# Patient Record
Sex: Female | Born: 1955 | Hispanic: No | Marital: Married | Smoking: Never smoker
Health system: Southern US, Community
[De-identification: ages and names within clinical notes are randomized; demographics above are authoritative.]

## PROBLEM LIST (undated history)

## (undated) DIAGNOSIS — M1990 Unspecified osteoarthritis, unspecified site: Secondary | ICD-10-CM

## (undated) DIAGNOSIS — E7800 Pure hypercholesterolemia, unspecified: Secondary | ICD-10-CM

## (undated) DIAGNOSIS — K649 Unspecified hemorrhoids: Secondary | ICD-10-CM

## (undated) DIAGNOSIS — R112 Nausea with vomiting, unspecified: Secondary | ICD-10-CM

## (undated) DIAGNOSIS — K219 Gastro-esophageal reflux disease without esophagitis: Secondary | ICD-10-CM

## (undated) DIAGNOSIS — K589 Irritable bowel syndrome without diarrhea: Secondary | ICD-10-CM

## (undated) DIAGNOSIS — Z9889 Other specified postprocedural states: Secondary | ICD-10-CM

## (undated) HISTORY — PX: CHOLECYSTECTOMY: SHX55

## (undated) HISTORY — DX: Pure hypercholesterolemia, unspecified: E78.00

## (undated) HISTORY — PX: RECTO-VAGINAL FISSURE REPAIR: SHX5277

## (undated) HISTORY — DX: Gastro-esophageal reflux disease without esophagitis: K21.9

## (undated) HISTORY — PX: OTHER SURGICAL HISTORY: SHX169

---

## 2005-05-02 ENCOUNTER — Ambulatory Visit: Payer: Self-pay | Admitting: Internal Medicine

## 2010-04-04 ENCOUNTER — Ambulatory Visit: Payer: Self-pay | Admitting: Internal Medicine

## 2010-11-07 ENCOUNTER — Other Ambulatory Visit (INDEPENDENT_AMBULATORY_CARE_PROVIDER_SITE_OTHER): Payer: Self-pay | Admitting: *Deleted

## 2010-11-07 DIAGNOSIS — K219 Gastro-esophageal reflux disease without esophagitis: Secondary | ICD-10-CM

## 2010-11-08 MED ORDER — ESOMEPRAZOLE MAGNESIUM 40 MG PO CPDR: 40.0000 mg | DELAYED_RELEASE_CAPSULE | ORAL | Status: DC

## 2010-11-09 NOTE — Telephone Encounter (Signed)
RX approved.

## 2011-03-28 ENCOUNTER — Encounter (INDEPENDENT_AMBULATORY_CARE_PROVIDER_SITE_OTHER): Payer: Self-pay | Admitting: *Deleted

## 2011-03-29 ENCOUNTER — Telehealth (INDEPENDENT_AMBULATORY_CARE_PROVIDER_SITE_OTHER): Payer: Self-pay | Admitting: *Deleted

## 2011-03-29 NOTE — Telephone Encounter (Signed)
Patient lmom, states you are trying to get in touch with her about sch'dling appt, she said just sch'd it and leave info on her machine it it's not good she will call to resch

## 2011-04-05 ENCOUNTER — Encounter (INDEPENDENT_AMBULATORY_CARE_PROVIDER_SITE_OTHER): Payer: Self-pay | Admitting: Internal Medicine

## 2011-04-05 ENCOUNTER — Ambulatory Visit (INDEPENDENT_AMBULATORY_CARE_PROVIDER_SITE_OTHER): Payer: Self-pay | Admitting: Internal Medicine

## 2011-04-05 DIAGNOSIS — E7800 Pure hypercholesterolemia, unspecified: Secondary | ICD-10-CM

## 2011-04-05 DIAGNOSIS — K219 Gastro-esophageal reflux disease without esophagitis: Secondary | ICD-10-CM

## 2011-04-05 NOTE — Telephone Encounter (Signed)
Appt has been scheduled for today, 04/05/11.

## 2011-04-05 NOTE — Patient Instructions (Signed)
Will schedule a colonoscopy with Dr Karilyn Cota.  She has never undergone a colonoscopy in the past.  Continue present medications.

## 2011-04-05 NOTE — Progress Notes (Signed)
Subjective:     Patient ID: Ashley Carter, female   DOB: 09-29-1955, 55 y.o.   MRN: [id number]  HPI On the 17 and 18 of September she had epigastric pain.  On the 19th she had LFTs and she assumes they were normal. (see below labs)  On the 29th of September she was uncomfortable in her epigastric region with pressure and nausea for about 4 hours.   02/17/2011 she had epigastric discomfort which lasted less than 5 minutes.  She says since she has been on the Urso her symptoms are better.  She has been on Urso since 2010.  She says it has helped. Her acid reflux is pretty controlled.  She can tell if she ate the wrong things. Her appetite is good. No weight loss.  Her first ERCP was in [facility] at [facility]. She had a second in her ERCP in 2007. BM x 3 a day and some days she will skip a day.  She has never undergone a colonoscopy in the pat.  12/27/2010 Direct bili 0.15, Total bili 0.4, AST 27, ALT 22, ALP 139, Albumin 4.2 Review of Systems Current Outpatient Prescriptions  Medication Sig Dispense Refill  . aspirin 81 MG tablet Take 81 mg by mouth daily.        Marland Kitchen esomeprazole (NEXIUM) 40 MG capsule Take 1 capsule (40 mg total) by mouth every morning.  30 capsule  5  . hydrochlorothiazide (MICROZIDE) 12.5 MG capsule Take 12.5 mg by mouth daily.        . Multiple Vitamin (MULTIVITAMIN) tablet Take 1 tablet by mouth daily.        . niacin (NIASPAN) 1000 MG CR tablet Take 1,000 mg by mouth 2 (two) times daily.        . ursodiol (ACTIGALL) 500 MG tablet Take 500 mg by mouth 2 (two) times daily.        Past Medical History  Diagnosis Date  . GERD (gastroesophageal reflux disease)   . High cholesterol    Past Surgical History  Procedure Date  . Spincter of oddi dysfunction   . Cholecystectomy     1993  . Recto-vaginal fissure repair     after child birth   Family Status  Relation Status Death Age  . Mother Deceased     unknown, CVA  . Father Deceased     copd  . Sister Alive     one deceased  from auto acc., 4 alive. One has diabetes.   . Brother Alive     good health, alcoholic   History   Social History  . Marital Status: Single    Spouse Name: N/A    Number of Children: N/A  . Years of Education: N/A   Occupational History  . Not on file.   Social History Main Topics  . Smoking status: Never Smoker   . Smokeless tobacco: Not on file  . Alcohol Use: No  . Drug Use: No  . Sexually Active: Not on file   Other Topics Concern  . Not on file   Social History Narrative  . No narrative on file   Allergies  Allergen Reactions  . Morphine And Related   . Vibramycin       Objective:   Physical Exam  Filed Vitals:   04/05/11 1435  BP: 124/72  Pulse: 72  Temp: 97.9 F (36.6 C)  Height: 5\' 6"  (1.676 m)  Weight: 150 lb 14.4 oz (68.448 kg)    Alert and  oriented. Skin warm and dry. Oral mucosa is moist. Natural teeth in good condition. Sclera anicteric, conjunctivae is pink. Thyroid not enlarged. No cervical lymphadenopathy. Lungs clear. Heart regular rate and rhythm.  Abdomen is soft. Bowel sounds are positive. No hepatomegaly. No abdominal masses felt. No tenderness.  No edema to lower extremities. Patient is alert and oriented.      Assessment:   Spincter of Oddi Dysfunction.  In need of a colonoscopy    Plan:    Continue present medication. F/u in one year.

## 2011-04-27 ENCOUNTER — Encounter (INDEPENDENT_AMBULATORY_CARE_PROVIDER_SITE_OTHER): Payer: Self-pay | Admitting: *Deleted

## 2011-04-27 ENCOUNTER — Other Ambulatory Visit (INDEPENDENT_AMBULATORY_CARE_PROVIDER_SITE_OTHER): Payer: Self-pay | Admitting: *Deleted

## 2011-04-27 DIAGNOSIS — Z1211 Encounter for screening for malignant neoplasm of colon: Secondary | ICD-10-CM

## 2011-05-01 ENCOUNTER — Other Ambulatory Visit (INDEPENDENT_AMBULATORY_CARE_PROVIDER_SITE_OTHER): Payer: Self-pay | Admitting: Internal Medicine

## 2011-05-10 ENCOUNTER — Telehealth (INDEPENDENT_AMBULATORY_CARE_PROVIDER_SITE_OTHER): Payer: Self-pay | Admitting: *Deleted

## 2011-05-10 NOTE — Telephone Encounter (Signed)
Requesting MD/PCP:       Name & DOB: Ashley Carter 06/05/1955     Procedure: TCS  Reason/Indication:  screening  Has patient had this procedure before?  no  If so, when, by whom and where?    Is there a family history of colon cancer?  no  Who?  What age when diagnosed?    Is patient diabetic?   no      Does patient have prosthetic heart valve?  no  Do you have a pacemaker?  no  Has patient had joint replacement within last 12 months?  no  Is patient on Coumadin, Plavix and/or Aspirin? yes  Medications: asa 81 mg daily, nexium 40 mg daily, hctz 12.5 mg daily, multi vit, niacin 1000 mg bid, ursodiol 500 mg bid  Allergies: morphine, related vibramycin  Ph[facility]  Medication Adjustment: asa 2 days  Procedure date & time: 05/31/11 @ 1200

## 2011-05-11 NOTE — Telephone Encounter (Signed)
agree

## 2011-05-22 ENCOUNTER — Encounter (HOSPITAL_COMMUNITY): Payer: Self-pay | Admitting: Pharmacy Technician

## 2011-05-30 MED ORDER — SODIUM CHLORIDE 0.45 % IV SOLN
Freq: Once | INTRAVENOUS | Status: AC
  Administered 2011-05-31: 12:00:00 via INTRAVENOUS

## 2011-05-31 ENCOUNTER — Encounter (HOSPITAL_COMMUNITY)
Admission: RE | Disposition: A | Discharge status: Home / Self Care | Payer: Self-pay | Source: Ambulatory Visit | Attending: Internal Medicine

## 2011-05-31 ENCOUNTER — Ambulatory Visit (HOSPITAL_COMMUNITY)
Admission: RE | Admit: 2011-05-31 | Discharge: 2011-05-31 | Disposition: A | Discharge status: Home / Self Care | Payer: BC Managed Care – PPO | Source: Ambulatory Visit | Attending: Internal Medicine | Admitting: Internal Medicine

## 2011-05-31 ENCOUNTER — Encounter (HOSPITAL_COMMUNITY): Payer: Self-pay

## 2011-05-31 DIAGNOSIS — Z1211 Encounter for screening for malignant neoplasm of colon: Secondary | ICD-10-CM | POA: Insufficient documentation

## 2011-05-31 DIAGNOSIS — E7800 Pure hypercholesterolemia, unspecified: Secondary | ICD-10-CM | POA: Insufficient documentation

## 2011-05-31 HISTORY — DX: Unspecified osteoarthritis, unspecified site: M19.90

## 2011-05-31 HISTORY — PX: COLONOSCOPY: SHX5424

## 2011-05-31 HISTORY — DX: Irritable bowel syndrome, unspecified: K58.9

## 2011-05-31 HISTORY — DX: Unspecified hemorrhoids: K64.9

## 2011-05-31 SURGERY — COLONOSCOPY
Anesthesia: Moderate Sedation

## 2011-05-31 MED ORDER — MEPERIDINE HCL 50 MG/ML IJ SOLN
INTRAMUSCULAR | Status: AC
  Filled 2011-05-31: qty 1

## 2011-05-31 MED ORDER — MIDAZOLAM HCL 5 MG/5ML IJ SOLN
INTRAMUSCULAR | Status: DC | PRN
  Administered 2011-05-31: 1 mg via INTRAVENOUS
  Administered 2011-05-31 (×2): 2 mg via INTRAVENOUS

## 2011-05-31 MED ORDER — STERILE WATER FOR IRRIGATION IR SOLN
Status: DC | PRN
  Administered 2011-05-31: 12:00:00

## 2011-05-31 MED ORDER — MIDAZOLAM HCL 5 MG/5ML IJ SOLN
INTRAMUSCULAR | Status: AC
  Filled 2011-05-31: qty 10

## 2011-05-31 MED ORDER — MEPERIDINE HCL 50 MG/ML IJ SOLN
INTRAMUSCULAR | Status: DC | PRN
  Administered 2011-05-31 (×2): 25 mg via INTRAVENOUS

## 2011-05-31 NOTE — Discharge Instructions (Signed)
usual medications and diet.Resume  No driving for 24 hours. Next screening exam in 10 years.   Colonoscopy Care After Read the instructions outlined below and refer to this sheet in the next few weeks. These discharge instructions provide you with general information on caring for yourself after you leave the hospital. Your doctor may also give you specific instructions. While your treatment has been planned according to the most current medical practices available, unavoidable complications occasionally occur. If you have any problems or questions after discharge, call your doctor. HOME CARE INSTRUCTIONS ACTIVITY:  You may resume your regular activity, but move at a slower pace for the next 24 hours.   Take frequent rest periods for the next 24 hours.   Walking will help get rid of the air and reduce the bloated feeling in your belly (abdomen).   No driving for 24 hours (because of the medicine (anesthesia) used during the test).   You may shower.   Do not sign any important legal documents or operate any machinery for 24 hours (because of the anesthesia used during the test).  NUTRITION:  Drink plenty of fluids.   You may resume your normal diet as instructed by your doctor.   Begin with a light meal and progress to your normal diet. Heavy or fried foods are harder to digest and may make you feel sick to your stomach (nauseated).   Avoid alcoholic beverages for 24 hours or as instructed.  MEDICATIONS:  You may resume your normal medications unless your doctor tells you otherwise.  WHAT TO EXPECT TODAY:  Some feelings of bloating in the abdomen.   Passage of more gas than usual.   Spotting of blood in your stool or on the toilet paper.  IF YOU HAD POLYPS REMOVED DURING THE COLONOSCOPY:  No aspirin products for 7 days or as instructed.   No alcohol for 7 days or as instructed.   Eat a soft diet for the next 24 hours.  FINDING OUT THE RESULTS OF YOUR TEST Not all test  results are available during your visit. If your test results are not back during the visit, make an appointment with your caregiver to find out the results. Do not assume everything is normal if you have not heard from your caregiver or the medical facility. It is important for you to follow up on all of your test results.  SEEK IMMEDIATE MEDICAL CARE IF:  You have more than a spotting of blood in your stool.   Your belly is swollen (abdominal distention).   You are nauseated or vomiting.   You have a fever.   You have abdominal pain or discomfort that is severe or gets worse throughout the day.  Document Released: 11/15/2003 Document Revised: 12/13/2010 Document Reviewed: 11/13/2007 [facility] Patient Information 2012 [place], [place].

## 2011-05-31 NOTE — Op Note (Signed)
COLONOSCOPY PROCEDURE REPORT  PATIENT:  Ashley Carter  MR#:  [id number] Birthdate:  May 05, 1955, 56 y.o., female Endoscopist:  Dr. Malissa Hippo, MD Procedure Date: 05/31/2011  Procedure:   Colonoscopy  Indications: Patient is a 56 year old Caucasian female who was undergoing a screening colonoscopy. This is patient's first exam.  Informed Consent:  Procedure and risks were reviewed with the patient and informed consent was obtained.  Medications:  Demerol 50 mg IV Versed 5 mg IV  Description of procedure:  After a digital rectal exam was performed, that colonoscope was advanced from the anus through the rectum and colon to the area of the cecum, ileocecal valve and appendiceal orifice. The cecum was deeply intubated. These structures were well-seen and photographed for the record. From the level of the cecum and ileocecal valve, the scope was slowly and cautiously withdrawn. The mucosal surfaces were carefully surveyed utilizing scope tip to flexion to facilitate fold flattening as needed. The scope was pulled down into the rectum where a thorough exam including retroflexion was performed.  Findings:   Prep satisfactory. Normal mucosa of the colon and rectum. Distal rectal scar contiguous with anorectal junction.  Therapeutic/Diagnostic Maneuvers Performed:  None  Complications:  None  Cecal Withdrawal Time:  11 minutes  Impression:  Normal colonoscopy.  Recommendations:  Standard instructions given. Next screening exam in 10 years  Roch Quach U  05/31/2011 12:47 PM  CC: Dr. No primary provider on file. & Dr. No ref. provider found

## 2011-05-31 NOTE — H&P (Signed)
Ashley Carter is an 56 y.o. female.   Chief Complaint: Patient is here for colonoscopy. HPI: Patient is a 56 year old Caucasian female who is here for average risk screening colonoscopy. This is patient's first exam. She has intermittent diarrhea felt to be secondary to irritable.bowel syndrome. She denies rectal bleeding. Family history is negative for colorectal carcinoma.  Past Medical History  Diagnosis Date  . GERD (gastroesophageal reflux disease)   . High cholesterol   . Arthritis   . IBS (irritable bowel syndrome)   . Hemorrhoid     Past Surgical History  Procedure Date  . Spincter of oddi dysfunction     repair x2  . Cholecystectomy     1993  . Recto-vaginal fissure repair x 2    after child birth    Family History  Problem Relation Age of Onset  . Anesthesia problems Neg Hx   . Hypotension Neg Hx   . Malignant hyperthermia Neg Hx   . Pseudochol deficiency Neg Hx    Social History:  reports that she has never smoked. She does not have any smokeless tobacco history on file. She reports that she does not drink alcohol or use illicit drugs.  Allergies:  Allergies  Allergen Reactions  . Kiwi Extract Swelling    Of throat  . Morphine And Related Other (See Comments)    Dry heaves   . Other Swelling    walnuts  . Vibramycin Other (See Comments)    ulcers    Medications Prior to Admission  Medication Dose Route Frequency Provider Last Rate Last Dose  . 0.45 % sodium chloride infusion   Intravenous Once Malissa Hippo, MD 20 mL/hr at 05/31/11 1208    . meperidine (DEMEROL) 50 MG/ML injection           . midazolam (VERSED) 5 MG/5ML injection            Medications Prior to Admission  Medication Sig Dispense Refill  . aspirin EC 81 MG tablet Take 81 mg by mouth daily.      Marland Kitchen esomeprazole (NEXIUM) 40 MG capsule Take 1 capsule (40 mg total) by mouth every morning.  30 capsule  5  . hydrochlorothiazide (MICROZIDE) 12.5 MG capsule Take 12.5 mg by mouth daily.         . Multiple Vitamin (MULITIVITAMIN WITH MINERALS) TABS Take 1 tablet by mouth daily.      . niacin (NIASPAN) 1000 MG CR tablet Take 1,000 mg by mouth 2 (two) times daily.          No results found for this or any previous visit (from the past 48 hour(s)). No results found.  Review of Systems  Constitutional: Negative for weight loss.  Gastrointestinal: Positive for diarrhea. Negative for abdominal pain, constipation, blood in stool and melena.    Blood pressure 131/78, pulse 86, temperature 97.7 F (36.5 C), temperature source Oral, resp. rate 18, height 5\' 6"  (1.676 m), weight 150 lb (68.04 kg), SpO2 97.00%. Physical Exam  Constitutional: She appears well-developed and well-nourished.  HENT:  Mouth/Throat: Oropharynx is clear and moist.  Eyes: Conjunctivae are normal. No scleral icterus.  Neck: No thyromegaly present.  Cardiovascular: Normal rate, regular rhythm and normal heart sounds.   No murmur heard. Respiratory: Effort normal and breath sounds normal.  GI: Soft. She exhibits no mass. There is no tenderness.  Musculoskeletal: She exhibits no edema.  Lymphadenopathy:    She has no cervical adenopathy.  Neurological: She is alert.  Skin:  Skin is warm and dry.     Assessment/Plan Average risk screening colonoscopy.  Ashley Carter U 05/31/2011, 12:20 PM

## 2011-06-07 ENCOUNTER — Encounter (HOSPITAL_COMMUNITY): Payer: Self-pay | Admitting: Internal Medicine

## 2011-11-07 ENCOUNTER — Other Ambulatory Visit (INDEPENDENT_AMBULATORY_CARE_PROVIDER_SITE_OTHER): Payer: Self-pay | Admitting: Internal Medicine

## 2011-11-07 NOTE — Telephone Encounter (Signed)
LM for patient to return the call to schedule a f/u before her next refill.

## 2011-11-07 NOTE — Telephone Encounter (Signed)
Will need ov prior to next refill

## 2011-11-09 NOTE — Telephone Encounter (Signed)
Patient has been called 4 times and a voice message left to return the call. Advised patient an apt needs to be scheduled before her next refill.

## 2012-03-09 ENCOUNTER — Other Ambulatory Visit (INDEPENDENT_AMBULATORY_CARE_PROVIDER_SITE_OTHER): Payer: Self-pay | Admitting: Internal Medicine

## 2012-04-01 ENCOUNTER — Encounter (INDEPENDENT_AMBULATORY_CARE_PROVIDER_SITE_OTHER): Payer: Self-pay | Admitting: Internal Medicine

## 2012-04-01 ENCOUNTER — Ambulatory Visit (INDEPENDENT_AMBULATORY_CARE_PROVIDER_SITE_OTHER): Payer: BC Managed Care – PPO | Admitting: Internal Medicine

## 2012-04-01 VITALS — BP 110/70 | HR 72 | Temp 97.5°F | Resp 18 | Ht 66.0 in | Wt 153.1 lb

## 2012-04-01 DIAGNOSIS — K8020 Calculus of gallbladder without cholecystitis without obstruction: Secondary | ICD-10-CM

## 2012-04-01 DIAGNOSIS — K219 Gastro-esophageal reflux disease without esophagitis: Secondary | ICD-10-CM

## 2012-04-01 DIAGNOSIS — K8050 Calculus of bile duct without cholangitis or cholecystitis without obstruction: Secondary | ICD-10-CM | POA: Insufficient documentation

## 2012-04-01 NOTE — Patient Instructions (Signed)
Please forward [place] a copy of your next bone density study.

## 2012-04-01 NOTE — Progress Notes (Signed)
Presenting complaint;  Followup for chronic GERD and biliary colic.  Subjective:  Patient is 56 year old Caucasian female who is here for yearly visit. She did undergo screening colonoscopy in February 2013. She also brings along a copy of her blood work reviewed. She states she is doing well. She has heartburn no more than once a week. She has occasional regurgitation. She denies dysphagia hoarseness nausea or vomiting. Her bowels move daily. She denies melena or frank rectal bleeding abdominal pain. She did experience single episode of hematochezia couple of months ago was on vacation. This year she has had 2 episodes of fleeting right upper quadrant pain. Since she has been on Urso she has not experienced severe or prolonged episode of pain. She is experiencing no side effects with Nexium. She states she had bone density study 3-4 years ago revealing osteopenia.  Current Medications: Current Outpatient Prescriptions  Medication Sig Dispense Refill  . Garcinia Cambogia-Chromium 500-200 MG-MCG TABS Take by mouth daily.      . hydrochlorothiazide (MICROZIDE) 12.5 MG capsule Take 12.5 mg by mouth daily.        . Multiple Vitamin (MULITIVITAMIN WITH MINERALS) TABS Take 1 tablet by mouth daily.      Marland Kitchen NEXIUM 40 MG capsule TAKE ONE CAPSULE BY MOUTH EVERY MORNING  30 capsule  11  . ursodiol (ACTIGALL) 500 MG tablet TAKE 1 TABLET BY MOUTH TWICE A DAY SUBSTITUTED FOR ACTIGALL  60 tablet  9     Objective: Blood pressure 110/70, pulse 72, temperature 97.5 F (36.4 C), temperature source Oral, resp. rate 18, height 5\' 6"  (1.676 m), weight 153 lb 1.6 oz (69.446 kg). Conjunctiva is pink. Sclera is nonicteric Oropharyngeal mucosa is normal. No neck masses or thyromegaly noted. Cardiac exam with regular rhythm normal S1 and S2. No murmur or gallop noted. Lungs are clear to auscultation. Abdomen is symmetrical soft and nontender without organomegaly or masses. No LE edema or clubbing  noted.  Labs/studies Results: Lab data from 08/23/2011. Bilirubin oh 0.5, AP 140(25-150), AST 34 ALT 24 albumin 3.9. TSH 2.77  Assessment:  #1. Chronic GERD. She is doing well with therapy. Since she gives history of osteopenia and she is on long-term PPI she should consider having bone density study next year. She should also consider taking calcium with vitamin D daily calcium with vitamin D daily. #2. History of recurrent biliary colic. She is status post ERCP with sphincterotomy x2 at [facility](2956 and 2007). She has been on Urso since September 2010 for recurrent biliary type of symptoms and has done quite well. She may have microlithiasis and Urso may be helping. Continue her on Urso as long as she has no side effects or if she has another episode with bump in Transaminases.  #3. She is up-to-date on CRC screening. Next screening would be in February 2023.  Plan:  Continue Nexium and Urso at current dose. Consider taking calcium with vitamin D 2 tablets daily. She will get [place] a copy of her next bone density study. Office visit in one year.

## 2012-05-13 ENCOUNTER — Encounter (INDEPENDENT_AMBULATORY_CARE_PROVIDER_SITE_OTHER): Payer: Self-pay

## 2012-05-29 ENCOUNTER — Ambulatory Visit (INDEPENDENT_AMBULATORY_CARE_PROVIDER_SITE_OTHER): Payer: BC Managed Care – PPO | Admitting: Internal Medicine

## 2012-06-12 ENCOUNTER — Ambulatory Visit (INDEPENDENT_AMBULATORY_CARE_PROVIDER_SITE_OTHER): Payer: BC Managed Care – PPO | Admitting: Internal Medicine

## 2012-12-25 ENCOUNTER — Other Ambulatory Visit (INDEPENDENT_AMBULATORY_CARE_PROVIDER_SITE_OTHER): Payer: Self-pay | Admitting: Internal Medicine

## 2012-12-30 ENCOUNTER — Encounter (INDEPENDENT_AMBULATORY_CARE_PROVIDER_SITE_OTHER): Payer: Self-pay | Admitting: Internal Medicine

## 2012-12-30 ENCOUNTER — Ambulatory Visit (INDEPENDENT_AMBULATORY_CARE_PROVIDER_SITE_OTHER): Payer: BC Managed Care – PPO | Admitting: Internal Medicine

## 2012-12-30 VITALS — BP 118/62 | HR 76 | Temp 97.9°F | Ht 66.0 in | Wt 152.6 lb

## 2012-12-30 DIAGNOSIS — K219 Gastro-esophageal reflux disease without esophagitis: Secondary | ICD-10-CM

## 2012-12-30 NOTE — Progress Notes (Signed)
Subjective:     Patient ID: Ashley Carter, female   DOB: February 21, 1956, 57 y.o.   MRN: [id number]  HPI Presents today with c/o of dysphagia. No vomiting.  In February she had an episode of vomiting. Vomited after eating a Fiber bar. No other symptoms. She vomited x 1.  In May she had IBS. She had diarrhea which lasted one day.  She tells me today her acid reflux is worse since she started the Nexium OTC this past June. It did not control her acid reflux. She went back on the Nexium 40mg . Her acid reflux is getting better. She has been back on it for about a week.  No foods are lodging in her esophagus.  Sometimes wraps will be slow to go down.  On August 9, she had epigastric pain which lasted about a minute. Hx of Spincter of Oddi Dysfunction. One September 10, her IBS was bad, and she was very nauseated. She c/o hoarseness. She tells me her sinuses are draining.  Her appetite is good. No weight loss. No abdominal pain. Her bowels move regularly.  08/2012 AST 13, ALT 8, ALP 108 H and H 12.7 and 35.8. MCV 76.3. 05/31/2011 Colonoscopy: Findings:  Prep satisfactory.  Normal mucosa of the colon and rectum.  Distal rectal scar contiguous with anorectal junction.   History of recurrent biliary colic. She is status post ERCP with sphincterotomy x2 at [facility](2956 and 2007).  She has been on Urso since September 2010 for recurrent biliary type of symptoms and has done quite well. She may have microlithiasis and Urso may be helping. Continue her on Urso as long as she has no side effects or if she has another episode with bump in  Transaminases.    She is up-to-date on CRC screening. Next screening would be in February 2023.    Review of Systems  See hpi   Current Outpatient Prescriptions  Medication Sig Dispense Refill  . aspirin 81 MG tablet Take 81 mg by mouth daily.      . Evening Primrose Oil 1000 MG CAPS Take by mouth.      . hydrochlorothiazide (MICROZIDE) 12.5 MG capsule Take 12.5 mg by  mouth daily.        Marland Kitchen Lysine 500 MG TABS Take by mouth.      . Multiple Vitamin (MULITIVITAMIN WITH MINERALS) TABS Take 1 tablet by mouth daily.      Marland Kitchen NEXIUM 40 MG capsule TAKE ONE CAPSULE BY MOUTH EVERY MORNING  30 capsule  11  . niacin (NIASPAN) 1000 MG CR tablet Take 1,000 mg by mouth at bedtime. 1500mg  a ay      . ursodiol (ACTIGALL) 500 MG tablet TAKE 1 TABLET BY MOUTH TWICE A DAY SUBSTITUTED FOR ACTIGALL  60 tablet  9   No current facility-administered medications for this visit.   Past Medical History  Diagnosis Date  . GERD (gastroesophageal reflux disease)   . High cholesterol   . Arthritis   . IBS (irritable bowel syndrome)   . Hemorrhoid    Past Surgical History  Procedure Laterality Date  . Spincter of oddi dysfunction      repair x2  . Cholecystectomy      1993  . Recto-vaginal fissure repair  x 2    after child birth  . Colonoscopy  05/31/2011    Procedure: COLONOSCOPY;  Surgeon: Malissa Hippo, MD;  Location: [facility];  Service: Endoscopy;  Laterality: N/A;  1200   Allergies  Allergen  Reactions  . Kiwi Extract Swelling    Of throat  . Morphine And Related Other (See Comments)    Dry heaves   . Other Swelling    walnuts  . Vibramycin [Doxycycline Calcium] Other (See Comments)    Ulcers  Allergic to Vibramycin        Objective:   Physical Exam Filed Vitals:   12/30/12 1549  BP: 118/62  Pulse: 76  Temp: 97.9 F (36.6 C)  Height: 5\' 6"  (1.676 m)  Weight: 152 lb 9.6 oz (69.219 kg)  Alert and oriented. Skin warm and dry. Oral mucosa is moist.   . Sclera anicteric, conjunctivae is pink. Thyroid not enlarged. No cervical lymphadenopathy. Lungs clear. Heart regular rate and rhythm.  Abdomen is soft. Bowel sounds are positive. No hepatomegaly. No abdominal masses felt. No tenderness.  No edema to lower extremities.       Assessment:    GERD. Better since starting back on the Nexium 40mg . She had break thru with OTC Nexium.     Plan:    She will  send [place] a copy of the bone density that is scheduled for October. OV in 1 yr. Calcium one a day.

## 2012-12-30 NOTE — Patient Instructions (Addendum)
Calcium once a day. Nexium 40mg  daily

## 2012-12-31 ENCOUNTER — Encounter (INDEPENDENT_AMBULATORY_CARE_PROVIDER_SITE_OTHER): Payer: Self-pay

## 2013-01-16 ENCOUNTER — Other Ambulatory Visit (INDEPENDENT_AMBULATORY_CARE_PROVIDER_SITE_OTHER): Payer: Self-pay | Admitting: Internal Medicine

## 2013-04-06 ENCOUNTER — Encounter (INDEPENDENT_AMBULATORY_CARE_PROVIDER_SITE_OTHER): Payer: Self-pay | Admitting: *Deleted

## 2013-06-25 ENCOUNTER — Other Ambulatory Visit (INDEPENDENT_AMBULATORY_CARE_PROVIDER_SITE_OTHER): Payer: Self-pay | Admitting: Internal Medicine

## 2013-12-17 ENCOUNTER — Ambulatory Visit (INDEPENDENT_AMBULATORY_CARE_PROVIDER_SITE_OTHER): Payer: BC Managed Care – PPO | Admitting: Internal Medicine

## 2013-12-17 ENCOUNTER — Encounter (INDEPENDENT_AMBULATORY_CARE_PROVIDER_SITE_OTHER): Payer: Self-pay | Admitting: Internal Medicine

## 2013-12-17 VITALS — BP 114/82 | HR 56 | Temp 97.6°F | Ht 66.0 in | Wt 159.2 lb

## 2013-12-17 DIAGNOSIS — K834 Spasm of sphincter of Oddi: Secondary | ICD-10-CM | POA: Insufficient documentation

## 2013-12-17 DIAGNOSIS — K219 Gastro-esophageal reflux disease without esophagitis: Secondary | ICD-10-CM

## 2013-12-17 NOTE — Progress Notes (Signed)
Subjective:    Patient ID: Ashley Carter, female    DOB: 12/09/1955, 58 y.o.   MRN: [id number]  HPI  Here today for f/u. She was last seen in September of 2014. She tells me her acid reflux is okay. Nexium is helping.  She usually has a BM 1-2 every other day. No melena or BRRB. She says when her Spincter of Oddi is acting up she will have diarrhea.  (Surgery at [facility]) Her appetite is good for the most part. No weight loss. She has gained 7 pounds since her last visit.  On August 9, she had epigastric pain which lasted about a minute. Hx of Spincter of Oddi Dysfunction.     Two moles to removed and were benign in 08/2013.    08/2012 AST 13, ALT 8, ALP 108 H and H 12.7 and 35.8. MCV 76.3.  05/31/2011 Colonoscopy:  Findings:  Prep satisfactory.  Normal mucosa of the colon and rectum.  Distal rectal scar contiguous with anorectal junction.  History of recurrent biliary colic. She is status post ERCP with sphincterotomy x2 at [facility](8676 and 2007).  She has been on Urso since September 2010 for recurrent biliary type of symptoms and has done quite well. She may have microlithiasis and Urso may be helping. Continue her on Urso as long as she has no side effects or if she has another episode with bump in  Transaminases.  She is up-to-date on CRC screening. Next screening would be in February 2023.   Review of Systems Past Medical History  Diagnosis Date  . GERD (gastroesophageal reflux disease)   . High cholesterol   . Arthritis   . IBS (irritable bowel syndrome)   . Hemorrhoid     Past Surgical History  Procedure Laterality Date  . Spincter of oddi dysfunction      repair x2  . Cholecystectomy      1993  . Recto-vaginal fissure repair  x 2    after child birth  . Colonoscopy  05/31/2011    Procedure: COLONOSCOPY;  Surgeon: Rogene Houston, MD;  Location: [facility];  Service: Endoscopy;  Laterality: N/A;  1200    Allergies  Allergen Reactions  . Kiwi Extract Swelling   Of throat  . Morphine And Related Other (See Comments)    Dry heaves   . Other Swelling    walnuts  . Vibramycin [Doxycycline Calcium] Other (See Comments)    Ulcers  Allergic to Vibramycin     Current Outpatient Prescriptions on File Prior to Visit  Medication Sig Dispense Refill  . aspirin 81 MG tablet Take 81 mg by mouth daily.      . Evening Primrose Oil 1000 MG CAPS Take by mouth. 1300mg       . hydrochlorothiazide (MICROZIDE) 12.5 MG capsule Take 12.5 mg by mouth daily.        Marland Kitchen NEXIUM 40 MG capsule TAKE ONE CAPSULE BY MOUTH EVERY MORNING  30 capsule  11  . ursodiol (ACTIGALL) 500 MG tablet TAKE 1 TABLET BY MOUTH TWICE A DAY*GENERIC FOR ACTIGALL*  60 tablet  11   No current facility-administered medications on file prior to visit.        Objective:   Physical Exam  Filed Vitals:   12/17/13 1435  BP: 114/82  Pulse: 56  Temp: 97.6 F (36.4 C)  Height: 5\' 6"  (1.676 m)  Weight: 159 lb 3.2 oz (72.213 kg)    Alert and oriented. Skin warm and dry. Oral  mucosa is moist.   . Sclera anicteric, conjunctivae is pink. Thyroid not enlarged. No cervical lymphadenopathy. Lungs clear. Heart regular rate and rhythm.  Abdomen is soft. Bowel sounds are positive. No hepatomegaly. No abdominal masses felt. No tenderness.  No edema to lower extremities. Patient is alert and oriented.       Assessment & Plan:  GERD. Satisfied at this time with the Nexium. Hx of Spincter Dysfunction. Presently taking Urso. OV in 1 yr with Dr. Laural Golden Need Bone scan report from [facility].

## 2013-12-17 NOTE — Patient Instructions (Signed)
Continue the Urso. OV in 1 yr.

## 2013-12-18 ENCOUNTER — Encounter (INDEPENDENT_AMBULATORY_CARE_PROVIDER_SITE_OTHER): Payer: Self-pay | Admitting: Internal Medicine

## 2013-12-18 DIAGNOSIS — K219 Gastro-esophageal reflux disease without esophagitis: Secondary | ICD-10-CM

## 2013-12-18 DIAGNOSIS — K743 Primary biliary cirrhosis: Secondary | ICD-10-CM

## 2013-12-22 MED ORDER — ESOMEPRAZOLE MAGNESIUM 40 MG PO CPDR: DELAYED_RELEASE_CAPSULE | ORAL | Status: DC

## 2013-12-22 MED ORDER — URSODIOL 500 MG PO TABS: ORAL_TABLET | ORAL | Status: DC

## 2014-01-04 ENCOUNTER — Ambulatory Visit (INDEPENDENT_AMBULATORY_CARE_PROVIDER_SITE_OTHER): Payer: BC Managed Care – PPO | Admitting: Internal Medicine

## 2014-04-14 ENCOUNTER — Encounter (INDEPENDENT_AMBULATORY_CARE_PROVIDER_SITE_OTHER): Payer: Self-pay

## 2014-08-31 ENCOUNTER — Encounter (INDEPENDENT_AMBULATORY_CARE_PROVIDER_SITE_OTHER): Payer: Self-pay | Admitting: *Deleted

## 2014-12-08 ENCOUNTER — Other Ambulatory Visit (INDEPENDENT_AMBULATORY_CARE_PROVIDER_SITE_OTHER): Payer: Self-pay | Admitting: Internal Medicine

## 2014-12-23 ENCOUNTER — Encounter (INDEPENDENT_AMBULATORY_CARE_PROVIDER_SITE_OTHER): Payer: Self-pay | Admitting: Internal Medicine

## 2014-12-23 ENCOUNTER — Ambulatory Visit (INDEPENDENT_AMBULATORY_CARE_PROVIDER_SITE_OTHER): Payer: BLUE CROSS/BLUE SHIELD | Admitting: Internal Medicine

## 2014-12-23 VITALS — BP 112/72 | HR 72 | Temp 98.2°F | Ht 66.0 in | Wt 151.4 lb

## 2014-12-23 DIAGNOSIS — K219 Gastro-esophageal reflux disease without esophagitis: Secondary | ICD-10-CM | POA: Diagnosis not present

## 2014-12-23 NOTE — Patient Instructions (Addendum)
Continue the Nexium. OV in 1 year. 

## 2014-12-23 NOTE — Progress Notes (Addendum)
Subjective:    Patient ID: Ashley Carter, female    DOB: [birth year], 59 y.o.   MRN: [id number]  HPI Here today for f/u of her GERD.She was last seen in September of 2015. She tells me she is doing well.  She says she has had several episodes of pressure in her epigastric region which she says was her bilary colic. She says she has had more episodes of IBS, but nothing she cannot deal with. She has a BM normally every other day.  If she is out of her fiber bars she has more problems with constipation. Appetite is good. She has maintained her weight.  GERD for the most part controlled with the Nexium. No dysphagia.  No melena or BRRB.    11/25/2014 Albumin 4.0, total bili 0.5, ALP 95, AST 18, ALT 12, Cholesterol 185, Triglycerides 164, HDL 48 WBC 4.6, H and H 11.8, MCV 78, Platelet ct 305.         05/31/2011 Colonoscopy:  Findings:  Prep satisfactory.  Normal mucosa of the colon and rectum.  Distal rectal scar contiguous with anorectal junction.   History of recurrent biliary colic. She is status post ERCP with sphincterotomy x2 at [facility](1191 and 2007).  She has been on Urso since September 2010 for recurrent biliary type of symptoms and has done quite well. She may have microlithiasis and Urso may be helping. Continue her on Urso as long as she has no side effects or if she has another episode with bump in  Transaminases.  She is up-to-date on CRC screening. Next screening would be in February 2023.    Review of Systems Past Medical History  Diagnosis Date  . GERD (gastroesophageal reflux disease)   . High cholesterol   . Arthritis   . IBS (irritable bowel syndrome)   . Hemorrhoid     Past Surgical History  Procedure Laterality Date  . Spincter of oddi dysfunction      repair x2  . Cholecystectomy      1993  . Recto-vaginal fissure repair  x 2    after child birth  . Colonoscopy  05/31/2011    Procedure: COLONOSCOPY;  Surgeon: Rogene Houston, MD;   [facility];  Service: Endoscopy;  Laterality: N/A;  1200    Allergies  Allergen Reactions  . Kiwi Extract Swelling    Of throat  . Morphine And Related Other (See Comments)    Dry heaves   . Other Swelling    walnuts  . Vibramycin [Doxycycline Calcium] Other (See Comments)    Ulcers  Allergic to Vibramycin     Current Outpatient Prescriptions on File Prior to Visit  Medication Sig Dispense Refill  . aspirin 81 MG tablet Take 81 mg by mouth daily.    . calcium-vitamin D (OSCAL WITH D) 500-200 MG-UNIT per tablet Take 1 tablet by mouth. 12oomg/1000IU    . esomeprazole (NEXIUM) 40 MG capsule TAKE ONE CAPSULE BY MOUTH EVERY MORNING 30 capsule 7  . estrogens, conjugated, (PREMARIN) 0.625 MG tablet Take 0.625 mg by mouth daily. Take daily for 21 days then do not take for 7 days.    . hydrochlorothiazide (MICROZIDE) 12.5 MG capsule Take 12.5 mg by mouth daily.      Javier Docker Oil 1000 MG CAPS Take 500 mg by mouth.    . ursodiol (ACTIGALL) 500 MG tablet TAKE 1 TABLET BY MOUTH TWICE A DAY*GENERIC FOR ACTIGALL* 60 tablet 11  . Evening Primrose Oil 1000 MG  CAPS Take by mouth. 1300mg     . lactobacillus acidophilus (BACID) TABS tablet Take 2 tablets by mouth 3 (three) times daily.     No current facility-administered medications on file prior to visit.        Objective:   Physical Exam Blood pressure 112/72, pulse 72, temperature 98.2 F (36.8 C), height 5\' 6"  (1.676 m), weight 151 lb 6.4 oz (68.675 kg). Alert and oriented. Skin warm and dry. Oral mucosa is moist.   . Sclera anicteric, conjunctivae is pink. Thyroid not enlarged. No cervical lymphadenopathy. Lungs clear. Heart regular rate and rhythm.  Abdomen is soft. Bowel sounds are positive. No hepatomegaly. No abdominal masses felt. No tenderness.  No edema to lower extremities.        Assessment & Plan:  GERD controlled with Nexium. She seems to be doing well. OV in 1 year. Biliary colic: She occasionally has .  Continue the Urso.

## 2014-12-31 ENCOUNTER — Encounter (INDEPENDENT_AMBULATORY_CARE_PROVIDER_SITE_OTHER): Payer: Self-pay

## 2015-02-14 ENCOUNTER — Other Ambulatory Visit (INDEPENDENT_AMBULATORY_CARE_PROVIDER_SITE_OTHER): Payer: Self-pay | Admitting: Internal Medicine

## 2015-07-25 ENCOUNTER — Other Ambulatory Visit (INDEPENDENT_AMBULATORY_CARE_PROVIDER_SITE_OTHER): Payer: Self-pay | Admitting: Internal Medicine

## 2015-10-17 ENCOUNTER — Encounter (INDEPENDENT_AMBULATORY_CARE_PROVIDER_SITE_OTHER): Payer: Self-pay | Admitting: Internal Medicine

## 2015-11-28 ENCOUNTER — Other Ambulatory Visit (INDEPENDENT_AMBULATORY_CARE_PROVIDER_SITE_OTHER): Payer: Self-pay | Admitting: Internal Medicine

## 2016-01-09 ENCOUNTER — Other Ambulatory Visit (INDEPENDENT_AMBULATORY_CARE_PROVIDER_SITE_OTHER): Payer: Self-pay | Admitting: Internal Medicine

## 2016-01-10 ENCOUNTER — Ambulatory Visit (INDEPENDENT_AMBULATORY_CARE_PROVIDER_SITE_OTHER): Payer: BLUE CROSS/BLUE SHIELD | Admitting: Internal Medicine

## 2016-01-10 ENCOUNTER — Other Ambulatory Visit (INDEPENDENT_AMBULATORY_CARE_PROVIDER_SITE_OTHER): Payer: Self-pay | Admitting: Internal Medicine

## 2016-01-10 ENCOUNTER — Encounter (INDEPENDENT_AMBULATORY_CARE_PROVIDER_SITE_OTHER): Payer: Self-pay | Admitting: Internal Medicine

## 2016-01-10 VITALS — BP 116/74 | HR 72 | Temp 97.2°F | Resp 18 | Ht 66.0 in | Wt 161.2 lb

## 2016-01-10 DIAGNOSIS — K834 Spasm of sphincter of Oddi: Secondary | ICD-10-CM

## 2016-01-10 DIAGNOSIS — K219 Gastro-esophageal reflux disease without esophagitis: Secondary | ICD-10-CM

## 2016-01-10 DIAGNOSIS — R1311 Dysphagia, oral phase: Secondary | ICD-10-CM

## 2016-01-10 DIAGNOSIS — R718 Other abnormality of red blood cells: Secondary | ICD-10-CM | POA: Diagnosis not present

## 2016-01-10 NOTE — Progress Notes (Signed)
Presenting complaint;  Follow-up for GERD epigastric pain. Patient complains of swallowing difficulty.  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 She was last seen on 12/23/2014. She has multiple complaints today. Lately she's been having more heartburn than she was having before. She is not having heartburn daily but has at least 3-4 times a week. She is also having more postprandial regurgitation. She previously has tried Prilosec and Zantac. She has occasional episodes of mild epigastric pain but she had one episode recently when pain was quite sharp but it lasted for few seconds. She states this pain reminded her of the pain she had prior to her second ERCP. She states she is been under a lot of stress on account of her husband's illness with new onset of A. fib and is on anticoagulants. She says her appetite is normal. She has gained 10 pounds since her last visit 1 year ago. She has noted change in her bowel habits since her last visit. She generally has formed stool every third day. Se denies melena or rectal bleeding. She hasn't had periods since age 76.  She also complains of dysphagia. She says when she eats a meal at times she feels there is not enough room between her tongue and roof of her mouth and she has to spit the food back. Once the food is past her throat she has no problem. She has not experienced facial or oral numbness. She is trying to exercise. She exercises at least 3 times a week. She has a copy of blood work that she had on 12/28/2015.   Current Medications: Outpatient Encounter Prescriptions as of 01/10/2016  Medication Sig  . aspirin 81 MG tablet Take 81 mg by mouth daily.  . calcium-vitamin D (OSCAL WITH D) 500-200 MG-UNIT per tablet Take 1 tablet by mouth. 12oomg/1000IU  . CRANBERRY PO Take 25,000 Units by mouth daily.  . DENTA 5000 PLUS 1.1 % CREA dental cream Place 1 application onto teeth at bedtime.   Marland Kitchen  esomeprazole (NEXIUM) 40 MG capsule TAKE ONE CAPSULE BY MOUTH EVERY MORNING  . ESTRACE VAGINAL 0.1 MG/GM vaginal cream Place 1 Applicatorful vaginally at bedtime.   Marland Kitchen estrogens, conjugated, (PREMARIN) 0.625 MG tablet Take 0.625 mg by mouth daily. Take daily for 21 days then do not take for 7 days.  . hydrochlorothiazide (MICROZIDE) 12.5 MG capsule Take 12.5 mg by mouth daily.    Javier Docker Oil 1000 MG CAPS Take 500 mg by mouth.  . Loratadine (CLARITIN) 10 MG CAPS Take 10 mg by mouth daily.  . nitrofurantoin, macrocrystal-monohydrate, (MACROBID) 100 MG capsule Take 100 mg by mouth 2 (two) times daily.   . ursodiol (ACTIGALL) 500 MG tablet TAKE 1 TABLET BY MOUTH TWICE A DAY*GENERIC FOR ACTIGALL*  . [DISCONTINUED] Evening Primrose Oil 1000 MG CAPS Take by mouth. 1300mg   . [DISCONTINUED] lactobacillus acidophilus (BACID) TABS tablet Take 2 tablets by mouth 3 (three) times daily.   No facility-administered encounter medications on file as of 01/10/2016.      Objective: Blood pressure 116/74, pulse 72, temperature 97.2 F (36.2 C), temperature source Oral, resp. rate 18, height 5\' 6"  (1.676 m), weight 161 lb 3.2 oz (73.1 kg). Patient is alert and in no acute distress. Conjunctiva is pink. Sclera is nonicteric Oropharyngeal mucosa is normal. Uvula is midline and no pooling of secretions noted. Dentition in satisfactory condition. No neck masses or thyromegaly noted. Cardiac exam with regular rhythm normal S1 and S2.  No murmur or gallop noted. Lungs are clear to auscultation. Abdomen;  No LE edema or clubbing noted.  Labs/studies Results: Lab data from 12/28/2015 (provided by patient ) WBC 3.6, H&H 11.6 and 35.5, MCV 75, platelet count 292K  Glucose 94, BUN 8, creatinine 0.68  Sodium 142., Potassium 3.8, chloride 99, CO2 29 . Serum calcium 9.4 . Bilirubin 0.6, AP 98, AST 21, ALT 21 and total protein 6.7 with albumin of 4.1.   H&H was 12.7 and 35.8 on 08/26/2012 with MCV of 76.3.   H&H was  12.8 and 38.6 on 04/04/2011 with MCV of 83.  Assessment:  #1. Chronic GERD. Nexium is not working but she filled a prescription yesterday. She will stay on Nexium until we can obtain samples of Dexilant. #2. Epigastric pain. She is having sporadic pain while on Urso. She has history of SOD dysfunction and underwent ERCP with biliary sph[facility]erotomy twice at [facility]. If her pain becomes more frequent or prolonged will do further workup with LFTs iand ultrasound. #3. Oral phase dysphagia. This symptom is very intriguing. No abnormality noted on oropharyngeal exam. He does not have underlying neurologic disorder to account for this symptom. She will be further evaluated by speech pathologist #4. Microcytosis. She is possibly developing iron deficiency anemia due to chronic PPI therapy. Her hemoglobin is now low normal but is been gradually dropping. He had normal colonoscopy in 09/03/2011.   Plan:  Hemoccult 1. Will change PPI to Dexilant when samples available. If she does well with Dexilant, prescription will be issued. Serum iron TIBC and ferritin. Consultation with speech pathologist. Office visit in 6 months.

## 2016-01-10 NOTE — Patient Instructions (Signed)
Hemoccult 1. Consultation with speech pathologist for evaluation of oral phase dysphagia. Physician will call with results of blood tests when completed.

## 2016-01-11 LAB — FERRITIN: FERRITIN: 6 ng/mL — AB (ref 20–288)

## 2016-01-11 LAB — IRON AND TIBC
%SAT: 6 % — AB (ref 11–50)
Iron: 31 ug/dL — ABNORMAL LOW (ref 45–160)
TIBC: 518 ug/dL — AB (ref 250–450)
UIBC: 487 ug/dL — ABNORMAL HIGH (ref 125–400)

## 2016-01-12 ENCOUNTER — Other Ambulatory Visit (INDEPENDENT_AMBULATORY_CARE_PROVIDER_SITE_OTHER): Payer: Self-pay | Admitting: *Deleted

## 2016-01-12 ENCOUNTER — Encounter (INDEPENDENT_AMBULATORY_CARE_PROVIDER_SITE_OTHER): Payer: Self-pay | Admitting: *Deleted

## 2016-01-12 DIAGNOSIS — D509 Iron deficiency anemia, unspecified: Secondary | ICD-10-CM

## 2016-01-23 ENCOUNTER — Telehealth (INDEPENDENT_AMBULATORY_CARE_PROVIDER_SITE_OTHER): Payer: Self-pay | Admitting: *Deleted

## 2016-01-23 NOTE — Telephone Encounter (Signed)
   Diagnosis:    Result(s)   Card 1: Negative:           Completed by: Thomas Hoff, LPN   HEMOCCULT SENSA DEVELOPER: [id number]   EXPIRATION DATE: 2020/05   HEMOCCULT SENSA CARD:  [id number]   EXPIRATION DATE: 30/20   CARD CONTROL RESULTS:  POSITIVE: Positive  NEGATIVE: Negative    ADDITIONAL COMMENTS: Patient was called with results.

## 2016-01-29 NOTE — Telephone Encounter (Signed)
Stool is guaiac negative. Patient aware. 

## 2016-01-30 ENCOUNTER — Encounter (INDEPENDENT_AMBULATORY_CARE_PROVIDER_SITE_OTHER): Payer: Self-pay

## 2016-02-07 ENCOUNTER — Telehealth (HOSPITAL_COMMUNITY): Payer: Self-pay | Admitting: Speech Pathology

## 2016-02-07 NOTE — Telephone Encounter (Signed)
Izora Gala- it looks like Mandy sched. this pt for office visit, however order is for MBSS. Can you schedule pt for 02-13-16@1pm ? Thanks [employer]

## 2016-02-10 ENCOUNTER — Telehealth (HOSPITAL_COMMUNITY): Payer: Self-pay | Admitting: Family Medicine

## 2016-02-10 NOTE — Telephone Encounter (Signed)
10/27 pt called [place] because we had left her a message and she erased it and I called to confirm the [place] for 10/30 with her

## 2016-02-13 ENCOUNTER — Ambulatory Visit (HOSPITAL_COMMUNITY)
Admission: RE | Admit: 2016-02-13 | Discharge: 2016-02-13 | Disposition: A | Discharge status: Home / Self Care | Payer: BLUE CROSS/BLUE SHIELD | Source: Ambulatory Visit | Attending: Internal Medicine | Admitting: Internal Medicine

## 2016-02-13 ENCOUNTER — Encounter (HOSPITAL_COMMUNITY): Payer: Self-pay | Admitting: Speech Pathology

## 2016-02-13 ENCOUNTER — Ambulatory Visit (HOSPITAL_COMMUNITY): Payer: BLUE CROSS/BLUE SHIELD | Attending: Internal Medicine | Admitting: Speech Pathology

## 2016-02-13 DIAGNOSIS — K834 Spasm of sphincter of Oddi: Secondary | ICD-10-CM | POA: Insufficient documentation

## 2016-02-13 DIAGNOSIS — R1311 Dysphagia, oral phase: Secondary | ICD-10-CM | POA: Insufficient documentation

## 2016-02-13 DIAGNOSIS — R1312 Dysphagia, oropharyngeal phase: Secondary | ICD-10-CM | POA: Insufficient documentation

## 2016-02-13 LAB — CBC
HEMATOCRIT: 39.6 % (ref 35.0–45.0)
HEMOGLOBIN: 12.7 g/dL (ref 11.7–15.5)
MCH: 25.6 pg — AB (ref 27.0–33.0)
MCHC: 32.1 g/dL (ref 32.0–36.0)
MCV: 79.8 fL — ABNORMAL LOW (ref 80.0–100.0)
MPV: 10 fL (ref 7.5–12.5)
Platelets: 265 10*3/uL (ref 140–400)
RBC: 4.96 MIL/uL (ref 3.80–5.10)
RDW: 15.8 % — ABNORMAL HIGH (ref 11.0–15.0)
WBC: 4.6 10*3/uL (ref 3.8–10.8)

## 2016-02-13 NOTE — Therapy (Signed)
[place], [place], [address] Phone: [phone]   Fax:  [phone]  Modified Barium Swallow  Patient Details  Name: Ashley Carter MRN: [id number] Date of Birth: 1956-03-13 No Data Recorded  Encounter Date: 02/13/2016      End of Session - 02/13/16 1545    Visit Number 1   Number of Visits 1   Authorization Type BCBS   SLP Start Time 1300   SLP Stop Time  [id number]   SLP Time Calculation (min) 38 min   Activity Tolerance Patient tolerated treatment well      Past Medical History:  Diagnosis Date  . Arthritis   . GERD (gastroesophageal reflux disease)   . Hemorrhoid   . High cholesterol   . IBS (irritable bowel syndrome)     Past Surgical History:  Procedure Laterality Date  . CHOLECYSTECTOMY     1993  . COLONOSCOPY  05/31/2011   Procedure: COLONOSCOPY;  Surgeon: Rogene Houston, MD;  [facility];  Service: Endoscopy;  Laterality: N/A;  1200  . RECTO-VAGINAL FISSURE REPAIR  x 2   after child birth  . spincter of oddi dysfunction     repair x2    There were no vitals filed for this visit.      Subjective Assessment - 02/13/16 1537    Subjective "I sometimes have to spit my food out."   Special Tests MBSS   Currently in Pain? No/denies             General - 02/13/16 1538      General Information   Date of Onset 01/30/16   HPI Patient is 60 year old Caucasian female who was referred for MBSS by Dr. Laural Golden due to recent complaints of oropharyngeal dysphagia. Past medical history significant for GERD treated with Nexium (changing to [place]), epigastric pain with history of spinchter of Oddi dysfunction and underwent ERCP with biliary sphincterotomy twice at [facility]. Lately she's been having more heartburn than she was having before. She is not having heartburn daily but has at least 3-4 times a week. She is also having more postprandial regurgitation. She has occasional episodes  of mild epigastric pain but she had one episode recently when pain was quite sharp but it lasted for few seconds. She states this pain reminded her of the pain she had prior to her second ERCP. She states she is been under a lot of stress on account of her husband's illness with new onset of A. fib and is on anticoagulants. She says her appetite is normal. She has gained 10 pounds since her last visit 1 year ago. She has noted change in her bowel habits since her last visit. She generally has formed stool every third day. Se denies melena or rectal bleeding. She hasn't had periods since age 15. She also complains of dysphagia. She says when she eats a meal at times she feels there is not enough room between her tongue and roof of her mouth and she has to spit the food back. Once the food is past her throat she has no problem. She has not experienced facial or oral numbness.   Type of Study MBS-Modified Barium Swallow Study   Previous Swallow Assessment None on record   Diet Prior to this Study Regular;Thin liquids   Temperature Spikes Noted No   Respiratory Status Room air   History of Recent Intubation No   Behavior/Cognition Alert;Cooperative;Pleasant mood   Oral Cavity Assessment  Within Functional Limits   Oral Care Completed by SLP No   Oral Cavity - Dentition Adequate natural dentition   Vision Functional for self feeding   Self-Feeding Abilities Able to feed self   Patient Positioning Upright in chair   Baseline Vocal Quality Normal   Volitional Cough Strong   Volitional Swallow Able to elicit   Anatomy Within functional limits   Pharyngeal Secretions Not observed secondary MBS            Oral Preparation/Oral Phase - 02/13/16 1539      Oral Preparation/Oral Phase   Oral Phase Within functional limits          Pharyngeal Phase - 02/13/16 1540      Pharyngeal Phase   Pharyngeal Phase Impaired     Pharyngeal - Thin   Pharyngeal- Thin Cup Within functional limits    Pharyngeal- Thin Straw Swallow initiation at pyriform sinus     Pharyngeal - Solids   Pharyngeal- Puree Within functional limits   Pharyngeal- Regular Within functional limits   Pharyngeal- Pill Within functional limits     Electrical Stimulation - Pharyngeal Phase   Was Electrical Stimulation Used No          Cricopharyngeal Phase - 02/13/16 1541      Cervical Esophageal Phase   Cervical Esophageal Phase Impaired     Cervical Esophageal Phase - Thin   Thin Straw Esophageal backflow into cervical esophagus     Cervical Esophageal Phase - Comment   Cervical Esophageal Comment Suspect primary esophageal phase dysphagia (see clinical impression)   Other Esophageal Phase Observations Evidence of backflow from distal esophagus to cervical esophagus          Plan - 02/13/16 1545    Clinical Impression Statement Pt seen for MBSS while sitting in the lateral position in [place] chair and presented with barium tinged thin water, puree, graham cracker, and barium tablet. Oropharyngeal swallow was [facility] with textures and consistencies presented. Pt with mild premature spillage with thin liquids (spilled to pyriforms with sequential straw sips which can be WNL) and piecemeal deglutition (double swallows to clear mild oral residuals after primary swallow). No penetration/aspiration or pharyngeal stasis observed throughout the duration of the study. Esophageal sweep was remarkable for delayed emptying in distal esophagus with slight narrowing near GE junction (pill only transiently delayed). Sequential straw sips resulted in retrograde movement from distal esophagus to lower cervical esophagus. Please see images on PACS for view. Perhaps pt's esophageal phase is negatively impacting sensation in oropharyngeal stages. Pt reports increased effort with swallowing and that "oral cavity feels small". Her anatomy was unremarkable in oropharynx. Pt also reports feeling like her food/liquid is coming back up  when she leans over, well after her meals. Pt may benefit from esophageal assessment. Will send results to Dr. Laural Golden. PLEASE SEE PACS IMAGES #13 and 14 under imaging tab of MBSS report   Consulted and Agree with Plan of Care Patient      Patient will benefit from skilled therapeutic intervention in order to improve the following deficits and impairments:   Dysphagia, oropharyngeal phase        Recommendations/Treatment - 02/13/16 1543      Swallow Evaluation Recommendations   Recommended Consults Consider esophageal assessment   SLP Diet Recommendations Age appropriate regular;Thin   Liquid Administration via Cup   Medication Administration Whole meds with liquid   Supervision Patient able to self feed   Postural Changes Remain upright for at least 30  minutes after feeds/meals;Seated upright at 90 degrees          Prognosis - 02/13/16 1544      Prognosis   Prognosis for Safe Diet Advancement Good     Individuals Consulted   Consulted and Agree with Results and Recommendations Patient   Report Sent to  Referring physician      Problem List Patient Active Problem List   Diagnosis Date Noted  . 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 Recurrent biliary colic [id number]  . High cholesterol 04/05/2011  . GERD (gastroesophageal reflux disease) 04/05/2011   Thank you,  Genene Churn, [place]  [facility] 02/13/2016, 3:54 PM  [place], [place], [address] Phone: [phone]   Fax:  [phone]  Name: Ashley Carter MRN: [id number] Date of Birth: Dec 03, 1955

## 2016-02-20 ENCOUNTER — Other Ambulatory Visit (INDEPENDENT_AMBULATORY_CARE_PROVIDER_SITE_OTHER): Payer: Self-pay | Admitting: *Deleted

## 2016-02-20 DIAGNOSIS — R1310 Dysphagia, unspecified: Secondary | ICD-10-CM

## 2016-02-21 ENCOUNTER — Encounter (INDEPENDENT_AMBULATORY_CARE_PROVIDER_SITE_OTHER): Payer: Self-pay | Admitting: *Deleted

## 2016-02-21 DIAGNOSIS — R1310 Dysphagia, unspecified: Secondary | ICD-10-CM | POA: Insufficient documentation

## 2016-03-23 ENCOUNTER — Ambulatory Visit (HOSPITAL_COMMUNITY)
Admission: RE | Admit: 2016-03-23 | Discharge: 2016-03-23 | Disposition: A | Discharge status: Home / Self Care | Payer: BLUE CROSS/BLUE SHIELD | Source: Ambulatory Visit | Attending: Internal Medicine | Admitting: Internal Medicine

## 2016-03-23 ENCOUNTER — Encounter (HOSPITAL_COMMUNITY)
Admission: RE | Disposition: A | Discharge status: Home / Self Care | Payer: Self-pay | Source: Ambulatory Visit | Attending: Internal Medicine

## 2016-03-23 ENCOUNTER — Encounter (HOSPITAL_COMMUNITY): Payer: Self-pay | Admitting: *Deleted

## 2016-03-23 DIAGNOSIS — Z79899 Other long term (current) drug therapy: Secondary | ICD-10-CM | POA: Diagnosis not present

## 2016-03-23 DIAGNOSIS — Z7982 Long term (current) use of aspirin: Secondary | ICD-10-CM | POA: Insufficient documentation

## 2016-03-23 DIAGNOSIS — R1312 Dysphagia, oropharyngeal phase: Secondary | ICD-10-CM | POA: Insufficient documentation

## 2016-03-23 DIAGNOSIS — K219 Gastro-esophageal reflux disease without esophagitis: Secondary | ICD-10-CM | POA: Diagnosis not present

## 2016-03-23 DIAGNOSIS — Z9049 Acquired absence of other specified parts of digestive tract: Secondary | ICD-10-CM | POA: Insufficient documentation

## 2016-03-23 DIAGNOSIS — R933 Abnormal findings on diagnostic imaging of other parts of digestive tract: Secondary | ICD-10-CM | POA: Diagnosis not present

## 2016-03-23 DIAGNOSIS — K2980 Duodenitis without bleeding: Secondary | ICD-10-CM | POA: Diagnosis not present

## 2016-03-23 DIAGNOSIS — K2970 Gastritis, unspecified, without bleeding: Secondary | ICD-10-CM | POA: Diagnosis not present

## 2016-03-23 DIAGNOSIS — R1310 Dysphagia, unspecified: Secondary | ICD-10-CM | POA: Insufficient documentation

## 2016-03-23 DIAGNOSIS — K319 Disease of stomach and duodenum, unspecified: Secondary | ICD-10-CM | POA: Insufficient documentation

## 2016-03-23 HISTORY — PX: ESOPHAGOGASTRODUODENOSCOPY: SHX5428

## 2016-03-23 HISTORY — PX: BIOPSY: SHX5522

## 2016-03-23 HISTORY — PX: ESOPHAGEAL DILATION: SHX303

## 2016-03-23 SURGERY — EGD (ESOPHAGOGASTRODUODENOSCOPY)
Anesthesia: Moderate Sedation

## 2016-03-23 MED ORDER — MIDAZOLAM HCL 5 MG/5ML IJ SOLN
INTRAMUSCULAR | Status: AC
  Filled 2016-03-23: qty 10

## 2016-03-23 MED ORDER — MEPERIDINE HCL 50 MG/ML IJ SOLN
INTRAMUSCULAR | Status: DC | PRN
  Administered 2016-03-23 (×2): 25 mg via INTRAVENOUS

## 2016-03-23 MED ORDER — MIDAZOLAM HCL 5 MG/5ML IJ SOLN
INTRAMUSCULAR | Status: DC | PRN
  Administered 2016-03-23 (×2): 2 mg via INTRAVENOUS

## 2016-03-23 MED ORDER — SODIUM CHLORIDE 0.9 % IV SOLN
INTRAVENOUS | Status: DC
  Administered 2016-03-23: 13:00:00 via INTRAVENOUS

## 2016-03-23 MED ORDER — MEPERIDINE HCL 50 MG/ML IJ SOLN
INTRAMUSCULAR | Status: AC
  Filled 2016-03-23: qty 1

## 2016-03-23 MED ORDER — BUTAMBEN-TETRACAINE-BENZOCAINE 2-2-14 % EX AERO
INHALATION_SPRAY | CUTANEOUS | Status: DC | PRN
  Administered 2016-03-23: 2 via TOPICAL

## 2016-03-23 NOTE — Discharge Instructions (Signed)
Resume aspirin on 03/24/2016. Resume other medications as before. Resume usual diet. No driving for 24 hours. Physician will call with biopsy results.    Upper Endoscopy, Care After Refer to this sheet in the next few weeks. These instructions provide you with information about caring for yourself after your procedure. Your health care provider may also give you more specific instructions. Your treatment has been planned according to current medical practices, but problems sometimes occur. Call your health care provider if you have any problems or questions after your procedure. What can I expect after the procedure? After the procedure, it is common to have:  A sore throat.  Bloating.  Nausea. Follow these instructions at home:  Follow instructions from your health care provider about what to eat or drink after your procedure.  Return to your normal activities as told by your health care provider. Ask your health care provider what activities are safe for you.  Take over-the-counter and prescription medicines only as told by your health care provider.  Do not drive for 24 hours if you received a sedative.  Keep all follow-up visits as told by your health care provider. This is important. Contact a health care provider if:  You have a sore throat that lasts longer than one day.  You have trouble swallowing. Get help right away if:  You have a fever.  You vomit blood or your vomit looks like coffee grounds.  You have bloody, black, or tarry stools.  You have a severe sore throat or you cannot swallow.  You have difficulty breathing.  You have severe pain in your chest or belly. This information is not intended to replace advice given to you by your health care provider. Make sure you discuss any questions you have with your health care provider. Document Released: 10/02/2011 Document Revised: 09/08/2015 Document Reviewed: 01/13/2015 Elsevier Interactive Patient  Education  2017 [employer].

## 2016-03-23 NOTE — H&P (Signed)
Ashley Carter is an 60 y.o. female.   Chief Complaint: Patient is here for EGD and ED. HPI: Patient is 60 year old Caucasian female who has chronic GERD. She was evaluated for dysphagia felt to be oropharyngeal. She was evaluated by Ms. Genene Churn SLP. No significant abnormality noted in first and second phase of deglutition. However she was noted to have slight narrowing at distal esophagus with slight delay in passage of barium pill into her stomach. It was therefore decided to proceed with further evaluation. Heartburn is well controlled with PPI.   Past Medical History:  Diagnosis Date  . Arthritis   . GERD (gastroesophageal reflux disease)   . Hemorrhoid   . High cholesterol   . IBS (irritable bowel syndrome)     Past Surgical History:  Procedure Laterality Date  . CHOLECYSTECTOMY     1993  . COLONOSCOPY  05/31/2011   Procedure: COLONOSCOPY;  Surgeon: Rogene Houston, MD;  [facility];  Service: Endoscopy;  Laterality: N/A;  1200  . RECTO-VAGINAL FISSURE REPAIR  x 2   after child birth  . spincter of oddi dysfunction     repair x2    Family History  Problem Relation Age of Onset  . Anesthesia problems Neg Hx   . Hypotension Neg Hx   . Malignant hyperthermia Neg Hx   . Pseudochol deficiency Neg Hx   . Colon cancer Neg Hx    Social History:  reports that she has never smoked. She has never used smokeless tobacco. She reports that she does not drink alcohol or use drugs.  Allergies:  Allergies  Allergen Reactions  . Kiwi Extract Anaphylaxis and Swelling    Swelling of the throat  . Other Anaphylaxis and Swelling    English walnuts  . Morphine And Related Nausea And Vomiting    Dry heaves   . Vibramycin [Doxycycline Calcium] Other (See Comments)    Ulcers     Medications Prior to Admission  Medication Sig Dispense Refill  . aspirin 81 MG tablet Take 81 mg by mouth daily.    . Calcium Carbonate-Vit D-Min (CALCIUM 1200) 1200-1000 MG-UNIT CHEW  Chew 1 tablet by mouth daily.    Marland Kitchen CRANBERRY PO Take 25,000 Units by mouth 2 (two) times daily.     . DENTA 5000 PLUS 1.1 % CREA dental cream Place 1 application onto teeth at bedtime.     Marland Kitchen esomeprazole (NEXIUM) 40 MG capsule TAKE ONE CAPSULE BY MOUTH EVERY MORNING 30 capsule 5  . ESTRACE VAGINAL 0.1 MG/GM vaginal cream Place 1 Applicatorful vaginally at bedtime.     . hydrochlorothiazide (MICROZIDE) 12.5 MG capsule Take 12.5 mg by mouth daily.      . Loratadine (CLARITIN) 10 MG CAPS Take 10 mg by mouth daily.    . Pediatric Multivitamins-Iron (FLINTSTONES PLUS IRON PO) Take 1 tablet by mouth daily.    . ursodiol (ACTIGALL) 500 MG tablet TAKE 1 TABLET BY MOUTH TWICE A DAY*GENERIC FOR ACTIGALL* 60 tablet 1    No results found for this or any previous visit (from the past 48 hour(s)). No results found.  ROS  Blood pressure 136/72, pulse 86, temperature 97.5 F (36.4 C), temperature source Oral, resp. rate 14, height 5\' 6"  (1.676 m), weight 160 lb (72.6 kg), SpO2 96 %. Physical Exam   Assessment/Plan Dysphagia in a patient with chronic GERD and abnormal modified barium study. EGD with ED.  Hildred Laser, MD 03/23/2016, 2:06 PM

## 2016-03-23 NOTE — Op Note (Signed)
[facility] Patient Name: Ashley Carter Procedure Date: 03/23/2016 1:49 PM MRN: [id number] Date of Birth: [birth year] Attending MD: Hildred Laser , MD CSN: [id number] Age: 60 Admit Type: Outpatient Procedure:                Upper GI endoscopy Indications:              Oropharyngeal phase dysphagia, Abnormal                            cine-esophagram Providers:                Hildred Laser, MD, Lurline Del, RN, Purcell Nails. [place],                            [employer] Referring MD:              Medicines:                Cetacaine spray, Meperidine 50 mg IV, Midazolam 4                            mg IV Complications:            No immediate complications. Estimated Blood Loss:     Estimated blood loss was minimal. Procedure:                Pre-Anesthesia Assessment:                           - Prior to the procedure, a History and Physical                            was performed, and patient medications and                            allergies were reviewed. The patient's tolerance of                            previous anesthesia was also reviewed. The risks                            and benefits of the procedure and the sedation                            options and risks were discussed with the patient.                            All questions were answered, and informed consent                            was obtained. Prior Anticoagulants: The patient                            last took aspirin 3 days prior to the procedure.                            ASA Grade  Assessment: II - A patient with mild                            systemic disease. After reviewing the risks and                            benefits, the patient was deemed in satisfactory                            condition to undergo the procedure.                           After obtaining informed consent, the endoscope was                            passed under direct vision. Throughout the      procedure, the patient's blood pressure, pulse, and                            oxygen saturations were monitored continuously. The                            [id number] scope was introduced through the                            mouth, and advanced to the second part of duodenum.                            The upper GI endoscopy was accomplished without                            difficulty. The patient tolerated the procedure                            well. Scope In: 2:17:18 PM Scope Out: 2:31:32 PM Total Procedure Duration: 0 hours 14 minutes 14 seconds  Findings:      The examined esophagus was normal.      No endoscopic abnormality was evident in the esophagus to explain the       patient's complaint of dysphagia. It was decided, however, to proceed       with dilation of the entire esophagus. The scope was withdrawn. Dilation       was performed with a Maloney dilator with no resistance at 1 Fr. The       dilation site was examined following endoscope reinsertion and showed no       change.      The Z-line was regular and was found 36 cm from the incisors.      Diffuse mild inflammation characterized by congestion (edema) and       granularity was found in the gastric antrum and in the prepyloric region       of the stomach. Biopsies were taken with a cold forceps for histology.      The exam was otherwise without abnormality.      Patchy mild inflammation characterized by erythema and friability was  found in the duodenal bulb.      The second portion of the duodenum was normal. Impression:               - Normal esophagus.                           - No endoscopic esophageal abnormality to explain                            patient's dysphagia. Esophagus dilated. Dilated.                           - Z-line regular, 36 cm from the incisors.                           - Gastritis. Biopsied.                           - The examination was otherwise normal.                            - Duodenitis.                           - Normal second portion of the duodenum. Moderate Sedation:      Moderate (conscious) sedation was administered by the endoscopy nurse       and supervised by the endoscopist. The following parameters were       monitored: oxygen saturation, heart rate, blood pressure, CO2       capnography and response to care. Total physician intraservice time was       23 minutes. Recommendation:           - Patient has a contact number available for                            emergencies. The signs and symptoms of potential                            delayed complications were discussed with the                            patient. Return to normal activities tomorrow.                            Written discharge instructions were provided to the                            patient.                           - Resume previous diet today.                           - Continue present medications.                           - Resume aspirin at prior  dose tomorrow.                           - Await pathology results. Procedure Code(s):        --- Professional ---                           [phone], Esophagogastroduodenoscopy, flexible,                            transoral; with biopsy, single or multiple                           43450, Dilation of esophagus, by unguided sound or                            bougie, single or multiple passes                           99152, Moderate sedation services provided by the                            same physician or other qualified health care                            professional performing the diagnostic or                            therapeutic service that the sedation supports,                            requiring the presence of an independent trained                            observer to assist in the monitoring of the                            patient's level of consciousness and physiological                             status; initial 15 minutes of intraservice time,                            patient age 51 years or older                           [phone], Moderate sedation services; each additional                            15 minutes intraservice time Diagnosis Code(s):        --- Professional ---                           K29.70, Gastritis, unspecified, without bleeding  K29.80, Duodenitis without bleeding                           R13.12, Dysphagia, oropharyngeal phase                           R93.3, Abnormal findings on diagnostic imaging of                            other parts of digestive tract CPT copyright 2016 American Medical Association. All rights reserved. The codes documented in this report are preliminary and upon coder review may  be revised to meet current compliance requirements. Hildred Laser, MD Hildred Laser, MD 03/23/2016 2:42:42 PM This report has been signed electronically. Number of Addenda: 0

## 2016-04-02 ENCOUNTER — Encounter (HOSPITAL_COMMUNITY): Payer: Self-pay | Admitting: Internal Medicine

## 2016-07-10 ENCOUNTER — Ambulatory Visit (INDEPENDENT_AMBULATORY_CARE_PROVIDER_SITE_OTHER): Payer: BLUE CROSS/BLUE SHIELD | Admitting: Internal Medicine

## 2016-12-09 ENCOUNTER — Other Ambulatory Visit (INDEPENDENT_AMBULATORY_CARE_PROVIDER_SITE_OTHER): Payer: Self-pay | Admitting: Internal Medicine

## 2016-12-26 ENCOUNTER — Ambulatory Visit (INDEPENDENT_AMBULATORY_CARE_PROVIDER_SITE_OTHER): Payer: BLUE CROSS/BLUE SHIELD | Admitting: Internal Medicine

## 2016-12-26 ENCOUNTER — Encounter (INDEPENDENT_AMBULATORY_CARE_PROVIDER_SITE_OTHER): Payer: Self-pay | Admitting: Internal Medicine

## 2016-12-26 VITALS — BP 116/80 | HR 64 | Temp 97.6°F | Ht 66.0 in | Wt 159.2 lb

## 2016-12-26 DIAGNOSIS — K219 Gastro-esophageal reflux disease without esophagitis: Secondary | ICD-10-CM | POA: Diagnosis not present

## 2016-12-26 MED ORDER — ESOMEPRAZOLE MAGNESIUM 40 MG PO CPDR: 40.0000 mg | DELAYED_RELEASE_CAPSULE | Freq: Every morning | ORAL | 3 refills | Status: DC

## 2016-12-26 NOTE — Progress Notes (Signed)
Subjective:    Patient ID: Ashley Carter, female    DOB: Sep 19, 1955, 61 y.o.   MRN: [id number]  HPI Here today for f/u. Last seen in September of 2017. She states she has not having any problems. Her GERD is controlled with Nexium. She denies any dysphagia.  Her appetite is good.  No weight . She usually has a BM but not every day.   She does not exercising.  Daughter underwent and ERCP and developed pancreatitis. She is doing better now.  Underwent GB removed in June.    She underwent an EGD/ED in December of 2017 for dysphagia which revealed:      The second portion of the duodenum was normal. Impression:               - Normal esophagus.                           - No endoscopic esophageal abnormality to explain                            patient's dysphagia. Esophagus dilated. Dilated.                           - Z-line regular, 36 cm from the incisors.                           - Gastritis. Biopsied.                           - The examination was otherwise normal.                           - Duodenitis.                           - Normal second portion of the duodenum. Biopsy negative for H. Pylori.   05/31/2011 Colonoscopy:  Findings:  Prep satisfactory.  Normal mucosa of the colon and rectum.  Distal rectal scar contiguous with anorectal junction.      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  Review of Systems Past Medical History:  Diagnosis Date  . Arthritis   . GERD (gastroesophageal reflux disease)   . Hemorrhoid   . High cholesterol   . IBS (irritable bowel syndrome)     Past Surgical History:  Procedure Laterality Date  . BIOPSY  03/23/2016   Procedure: BIOPSY;  Surgeon: Rogene Houston, MD;  [facility];  Service: Endoscopy;;  gastric bx  . CHOLECYSTECTOMY     1993  . COLONOSCOPY  05/31/2011   Procedure: COLONOSCOPY;  Surgeon: Rogene Houston, MD;  [facility];  Service: Endoscopy;   Laterality: N/A;  1200  . ESOPHAGEAL DILATION N/A 03/23/2016   Procedure: ESOPHAGEAL DILATION;  Surgeon: Rogene Houston, MD;  [facility];  Service: Endoscopy;  Laterality: N/A;  . ESOPHAGOGASTRODUODENOSCOPY N/A 03/23/2016   Procedure: ESOPHAGOGASTRODUODENOSCOPY (EGD);  Surgeon: Rogene Houston, MD;  [facility];  Service: Endoscopy;  Laterality: N/A;  1:50  . RECTO-VAGINAL FISSURE REPAIR  x 2   after child birth  . spincter of oddi dysfunction     repair  x2    Allergies  Allergen Reactions  . Kiwi Extract Anaphylaxis and Swelling    Swelling of the throat  . Other Anaphylaxis and Swelling    English walnuts  . Morphine And Related Nausea And Vomiting    Dry heaves   . Vibramycin [Doxycycline Calcium] Other (See Comments)    Ulcers     Current Outpatient Prescriptions on File Prior to Visit  Medication Sig Dispense Refill  . aspirin 81 MG tablet Take 81 mg by mouth daily.    . Calcium Carbonate-Vit D-Min (CALCIUM 1200) 1200-1000 MG-UNIT CHEW Chew 1 tablet by mouth daily.    Marland Kitchen CRANBERRY PO Take 25,000 Units by mouth 2 (two) times daily.     . DENTA 5000 PLUS 1.1 % CREA dental cream Place 1 application onto teeth at bedtime.     Marland Kitchen ESTRACE VAGINAL 0.1 MG/GM vaginal cream Place 1 Applicatorful vaginally at bedtime.     . hydrochlorothiazide (MICROZIDE) 12.5 MG capsule Take 12.5 mg by mouth daily.      . Loratadine (CLARITIN) 10 MG CAPS Take 10 mg by mouth daily.    . Pediatric Multivitamins-Iron (FLINTSTONES PLUS IRON PO) Take 1 tablet by mouth daily.    . ursodiol (ACTIGALL) 500 MG tablet TAKE 1 TABLET BY MOUTH TWICE A DAY*GENERIC FOR ACTIGALL* 60 tablet 1   No current facility-administered medications on file prior to visit.         Objective:   Physical Exam Blood pressure 116/80, pulse 64, temperature 97.6 F (36.4 C), height 5\' 6"  (1.676 m), weight 159 lb 3.2 oz (72.2 kg). Alert and oriented. Skin warm and dry. Oral mucosa is moist.   . Sclera  anicteric, conjunctivae is pink. Thyroid not enlarged. No cervical lymphadenopathy. Lungs clear. Heart regular rate and rhythm.  Abdomen is soft. Bowel sounds are positive. No hepatomegaly. No abdominal masses felt. No tenderness.  No edema to lower extremities.           Assessment & Plan:  GERD controlled with Nexium. She seems to be doing well. Rx for Nexium sent to her pharmacy.  OV I n 1 year.

## 2016-12-26 NOTE — Patient Instructions (Signed)
OV in 1 year.  

## 2017-02-18 ENCOUNTER — Other Ambulatory Visit (INDEPENDENT_AMBULATORY_CARE_PROVIDER_SITE_OTHER): Payer: Self-pay | Admitting: Internal Medicine

## 2017-06-19 ENCOUNTER — Encounter (INDEPENDENT_AMBULATORY_CARE_PROVIDER_SITE_OTHER): Payer: Self-pay | Admitting: Internal Medicine

## 2017-06-19 ENCOUNTER — Ambulatory Visit (INDEPENDENT_AMBULATORY_CARE_PROVIDER_SITE_OTHER): Payer: BLUE CROSS/BLUE SHIELD | Admitting: Internal Medicine

## 2017-06-19 VITALS — BP 150/82 | HR 80 | Temp 98.2°F | Ht 66.0 in | Wt 159.5 lb

## 2017-06-19 DIAGNOSIS — R1010 Upper abdominal pain, unspecified: Secondary | ICD-10-CM | POA: Diagnosis not present

## 2017-06-19 NOTE — Progress Notes (Signed)
Subjective:    Patient ID: Ashley Carter, female    DOB: Aug 22, 1955, 62 y.o.   MRN: [id number]  HPI Here today for f/u. Last seen in September for GERD. She was doing well.  GERD was controlled with Nexium. There was no dysphagia.  She tells me she is hurting in her epigastric region since Monday. She rates the pain at a 10 today. Rates at an 8 yesterday. She can feel the pain with movement. She has bloating. She has a lot of gas over the last 6 weeks. Her bowel moving okay. She has some slight constipation. She will take a stool softener and sometimes she will have diarrhea. She is not having any acid reflux no more than normal.  She says she has not felt like drinking any liquids. Has not eaten today. She has a hx of SOD dysfunction and has undergone two sphincterotomy  at [facility] Last ERCP was in 2007.     She underwent an EGD/ED in December of 2017 for dysphagia which revealed: The second portion of the duodenum was normal. Impression: - Normal esophagus. - No endoscopic esophageal abnormality to explain  patient's dysphagia. Esophagus dilated. Dilated. - Z-line regular, 36 cm from the incisors. - Gastritis. Biopsied. - The examination was otherwise normal. - Duodenitis. - Normal second portion of the duodenum. Biopsy negative for H. Pylori.    05/31/2011 Colonoscopy: Findings:  Prep satisfactory.  Normal mucosa of the colon and rectum.  Distal rectal scar contiguous with anorectal junction.    Review of Systems Past Medical History:  Diagnosis Date  . Arthritis   . GERD (gastroesophageal reflux disease)   . Hemorrhoid   . High cholesterol   . IBS (irritable bowel syndrome)     Past Surgical History:  Procedure Laterality Date  . BIOPSY  03/23/2016   Procedure:  BIOPSY;  Surgeon: Rogene Houston, MD;  [facility];  Service: Endoscopy;;  gastric bx  . CHOLECYSTECTOMY     1993  . COLONOSCOPY  05/31/2011   Procedure: COLONOSCOPY;  Surgeon: Rogene Houston, MD;  [facility];  Service: Endoscopy;  Laterality: N/A;  1200  . ESOPHAGEAL DILATION N/A 03/23/2016   Procedure: ESOPHAGEAL DILATION;  Surgeon: Rogene Houston, MD;  [facility];  Service: Endoscopy;  Laterality: N/A;  . ESOPHAGOGASTRODUODENOSCOPY N/A 03/23/2016   Procedure: ESOPHAGOGASTRODUODENOSCOPY (EGD);  Surgeon: Rogene Houston, MD;  [facility];  Service: Endoscopy;  Laterality: N/A;  1:50  . RECTO-VAGINAL FISSURE REPAIR  x 2   after child birth  . spincter of oddi dysfunction     repair x2    Allergies  Allergen Reactions  . Kiwi Extract Anaphylaxis and Swelling    Swelling of the throat  . Other Anaphylaxis and Swelling    English walnuts  . Morphine And Related Nausea And Vomiting    Dry heaves   . Vibramycin [Doxycycline Calcium] Other (See Comments)    Ulcers     Current Outpatient Medications on File Prior to Visit  Medication Sig Dispense Refill  . aspirin 81 MG tablet Take 81 mg by mouth daily.    . Calcium Carbonate-Vit D-Min (CALCIUM 1200) 1200-1000 MG-UNIT CHEW Chew 1 tablet by mouth daily.    . Cholecalciferol (VITAMIN D3) 1000 units CAPS Take 600 Units by mouth.    . CRANBERRY PO Take 25,000 Units by mouth 2 (two) times daily.     . DENTA 5000 PLUS 1.1 % CREA dental  cream Place 1 application onto teeth at bedtime.     Marland Kitchen esomeprazole (NEXIUM) 40 MG capsule Take 1 capsule (40 mg total) by mouth every morning. 90 capsule 3  . ESTRACE VAGINAL 0.1 MG/GM vaginal cream Place 1 Applicatorful vaginally at bedtime.     . hydrochlorothiazide (MICROZIDE) 12.5 MG capsule Take 12.5 mg by mouth daily.      . Loratadine (CLARITIN) 10 MG CAPS Take 10 mg by mouth daily.    . Pediatric Multivitamins-Iron (FLINTSTONES PLUS IRON PO) Take 1  tablet by mouth daily.    . ursodiol (ACTIGALL) 500 MG tablet TAKE 1 TABLET BY MOUTH TWICE A DAY 180 tablet 11   No current facility-administered medications on file prior to visit.         Objective:   Physical Exam Blood pressure (!) 150/82, pulse 80, temperature 98.2 F (36.8 C), height 5\' 6"  (1.676 m), weight 159 lb 8 oz (72.3 kg).  Alert and oriented. Skin warm and dry. Oral mucosa is moist.   . Sclera anicteric, conjunctivae is pink. Thyroid not enlarged. No cervical lymphadenopathy. Lungs clear. Heart regular rate and rhythm.  Abdomen is soft. Bowel sounds are positive. No hepatomegaly. No abdominal masses felt. Epigastric tenderness.  No edema to lower extremities.          Assessment & Plan:  Epigastric pain. ? SOD dysfunction Am going to get a CBC, Hepatic, Lipase, amylase and US abdomen.  I advised her she probably needs to go the ED if pain worsens.

## 2017-06-19 NOTE — Patient Instructions (Signed)
Labs and [place]. If pain worsens, go to the ED.

## 2017-06-20 LAB — CBC WITH DIFFERENTIAL/PLATELET
BASOS ABS: 32 {cells}/uL (ref 0–200)
Basophils Relative: 0.6 %
EOS PCT: 0.6 %
Eosinophils Absolute: 32 cells/uL (ref 15–500)
HEMATOCRIT: 40.3 % (ref 35.0–45.0)
HEMOGLOBIN: 14 g/dL (ref 11.7–15.5)
Lymphs Abs: 2173 cells/uL (ref 850–3900)
MCH: 28 pg (ref 27.0–33.0)
MCHC: 34.7 g/dL (ref 32.0–36.0)
MCV: 80.6 fL (ref 80.0–100.0)
MPV: 10.7 fL (ref 7.5–12.5)
Monocytes Relative: 10.1 %
NEUTROS ABS: 2528 {cells}/uL (ref 1500–7800)
Neutrophils Relative %: 47.7 %
Platelets: 274 10*3/uL (ref 140–400)
RBC: 5 10*6/uL (ref 3.80–5.10)
RDW: 12 % (ref 11.0–15.0)
Total Lymphocyte: 41 %
WBC mixed population: 535 cells/uL (ref 200–950)
WBC: 5.3 10*3/uL (ref 3.8–10.8)

## 2017-06-20 LAB — HEPATIC FUNCTION PANEL
AG Ratio: 1.6 (calc) (ref 1.0–2.5)
ALBUMIN MSPROF: 4.5 g/dL (ref 3.6–5.1)
ALT: 21 U/L (ref 6–29)
AST: 25 U/L (ref 10–35)
Alkaline phosphatase (APISO): 114 U/L (ref 33–130)
BILIRUBIN DIRECT: 0.2 mg/dL (ref 0.0–0.2)
Globulin: 2.8 g/dL (calc) (ref 1.9–3.7)
Indirect Bilirubin: 0.7 mg/dL (calc) (ref 0.2–1.2)
Total Bilirubin: 0.9 mg/dL (ref 0.2–1.2)
Total Protein: 7.3 g/dL (ref 6.1–8.1)

## 2017-06-20 LAB — LIPASE: LIPASE: 21 U/L (ref 7–60)

## 2017-06-20 LAB — AMYLASE: AMYLASE: 25 U/L (ref 21–101)

## 2017-06-21 ENCOUNTER — Ambulatory Visit (HOSPITAL_COMMUNITY)
Admission: RE | Admit: 2017-06-21 | Discharge: 2017-06-21 | Disposition: A | Discharge status: Home / Self Care | Payer: BLUE CROSS/BLUE SHIELD | Source: Ambulatory Visit | Attending: Internal Medicine | Admitting: Internal Medicine

## 2017-06-21 ENCOUNTER — Other Ambulatory Visit (INDEPENDENT_AMBULATORY_CARE_PROVIDER_SITE_OTHER): Payer: Self-pay | Admitting: Internal Medicine

## 2017-06-21 DIAGNOSIS — R1010 Upper abdominal pain, unspecified: Secondary | ICD-10-CM | POA: Diagnosis not present

## 2017-06-21 DIAGNOSIS — K760 Fatty (change of) liver, not elsewhere classified: Secondary | ICD-10-CM | POA: Diagnosis not present

## 2017-06-24 ENCOUNTER — Telehealth (INDEPENDENT_AMBULATORY_CARE_PROVIDER_SITE_OTHER): Payer: Self-pay | Admitting: Internal Medicine

## 2017-06-24 MED ORDER — DICYCLOMINE HCL 20 MG PO TABS: 20.0000 mg | ORAL_TABLET | ORAL | 1 refills | Status: DC | PRN

## 2017-06-24 NOTE — Telephone Encounter (Signed)
Rx for Dicyclomine 20mg  sent to her pharmacy

## 2017-07-01 ENCOUNTER — Encounter (INDEPENDENT_AMBULATORY_CARE_PROVIDER_SITE_OTHER): Payer: Self-pay | Admitting: Internal Medicine

## 2017-07-16 ENCOUNTER — Other Ambulatory Visit (INDEPENDENT_AMBULATORY_CARE_PROVIDER_SITE_OTHER): Payer: Self-pay | Admitting: Internal Medicine

## 2017-08-14 ENCOUNTER — Other Ambulatory Visit (INDEPENDENT_AMBULATORY_CARE_PROVIDER_SITE_OTHER): Payer: Self-pay | Admitting: Internal Medicine

## 2017-08-15 ENCOUNTER — Other Ambulatory Visit (INDEPENDENT_AMBULATORY_CARE_PROVIDER_SITE_OTHER): Payer: Self-pay | Admitting: *Deleted

## 2017-11-21 IMAGING — RF DG SWALLOWING FUNCTION - NRPT [facility]
13 of 16 series · 13 of 24 positions shown · non-contrast
Comparison: none

[Series 1: thin · 1 of 232 frames shown (1 of 2)]
[frame 35/232]
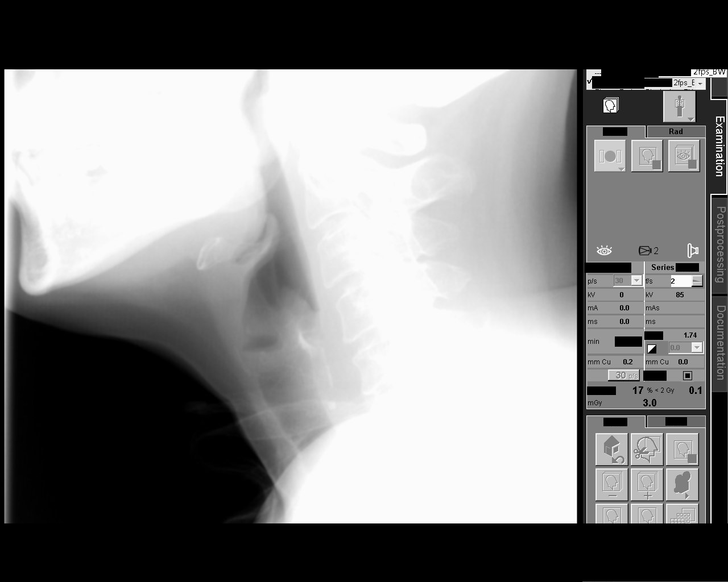

[Series 2: thin · 1 of 173 frames shown (2 of 2)]
[frame 79/173]
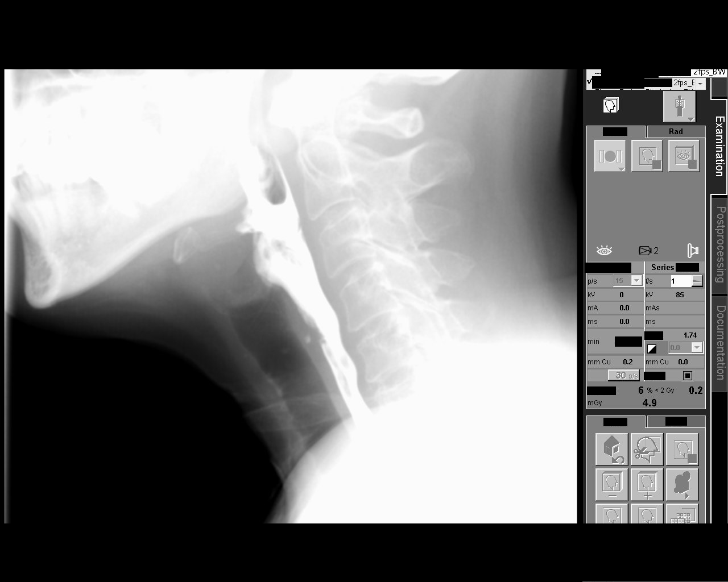

[Series 3: thin liquids - single cup · 1 of 168 frames shown]
[frame 162/168]
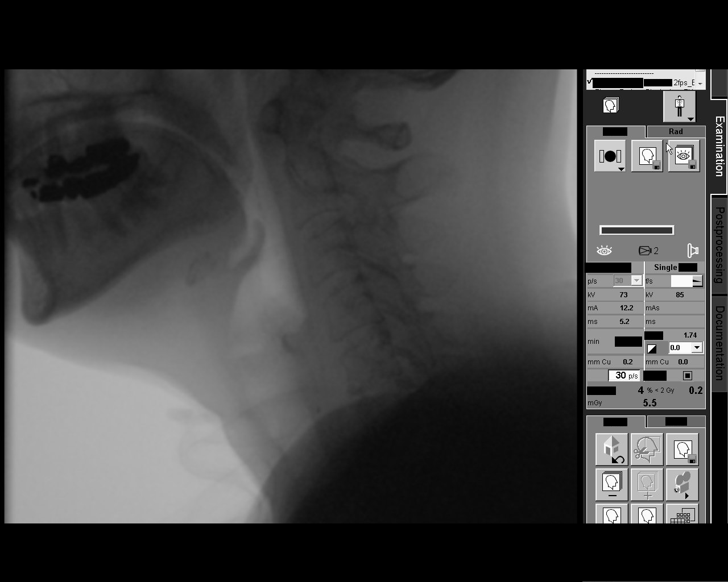

[Series 5: puree · 1 of 169 frames shown]
[frame 26/169]
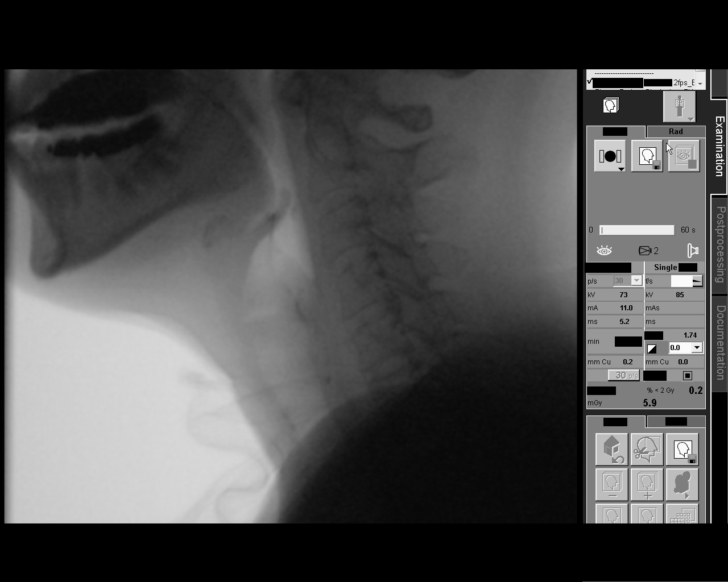

[Series 6: regular solids · 1 of 203 frames shown]
[frame 173/203]
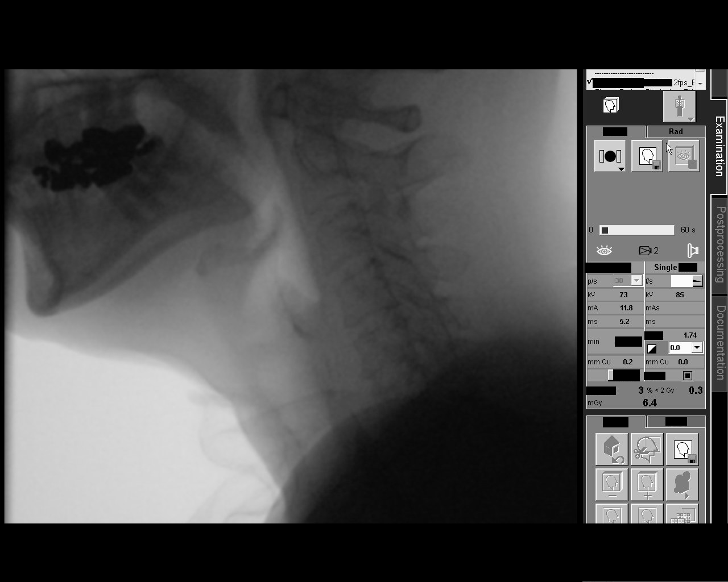

[Series 7: thin liquid - rapid consecutive straw · 1 of 271 frames shown]
[frame 237/271]
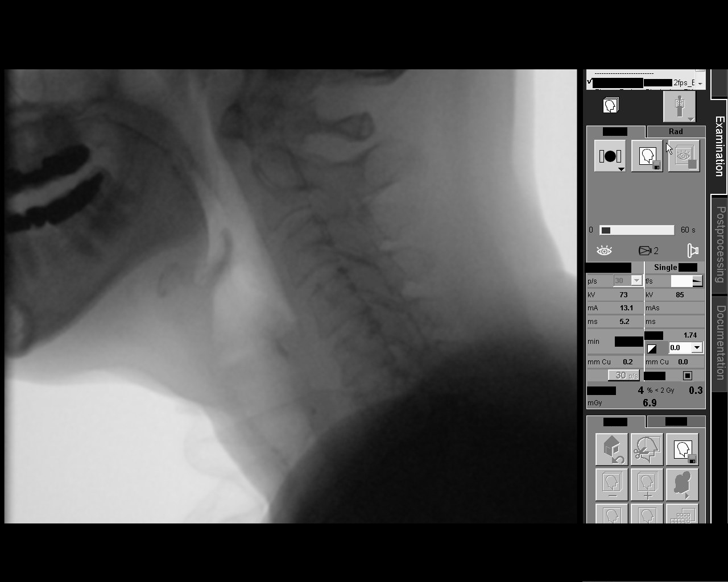

[Series 9: thin liquids -single straw · 1 of 119 frames shown]
[frame 60/119]
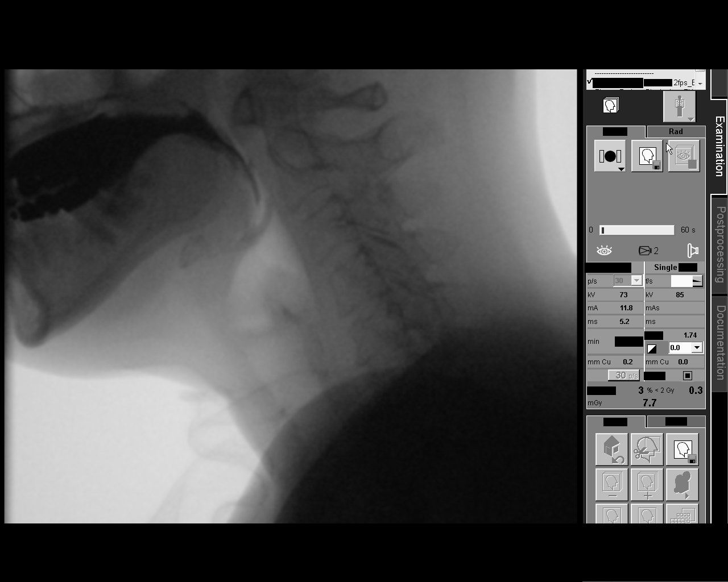

[Series 10: pill stasis · 1 of 155 frames shown]
[frame 24/155]
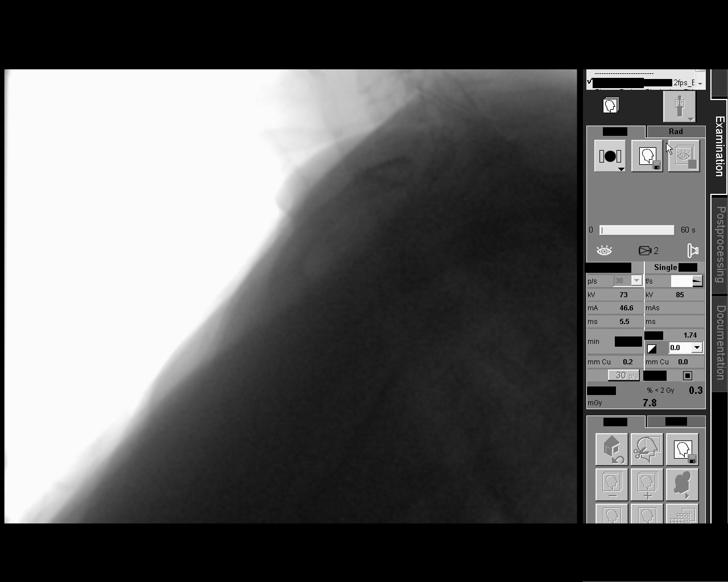

[Series 11: esophageal sweep stasis · 1 of 30 frames shown]
[frame 16/30]
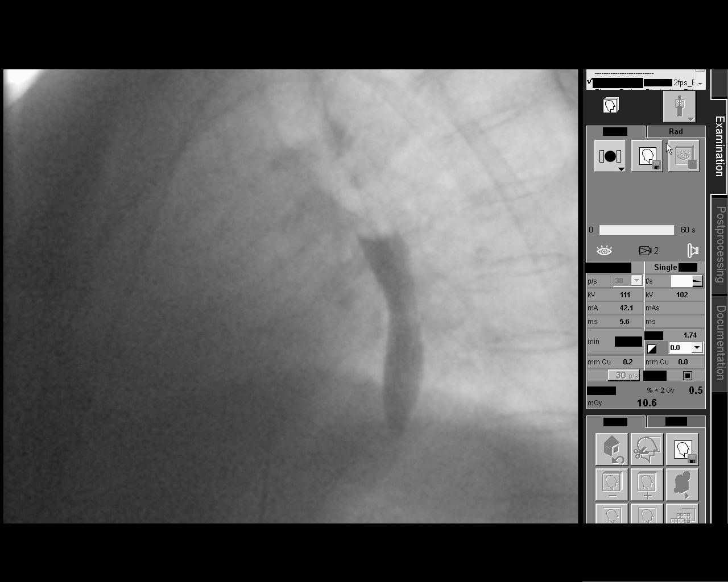

[Series 12: esophageal sweep · 1 of 33 frames shown]
[frame 29/33]
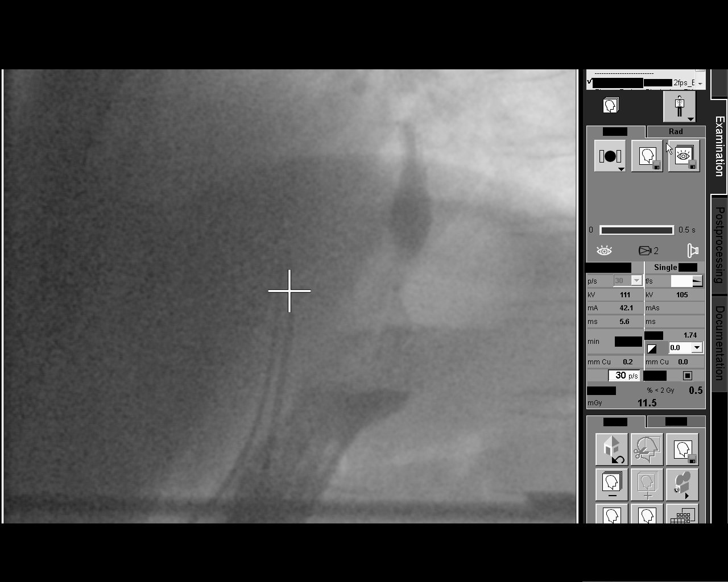

[Series 14: backflow · 1 of 255 frames shown]
[frame 39/255]
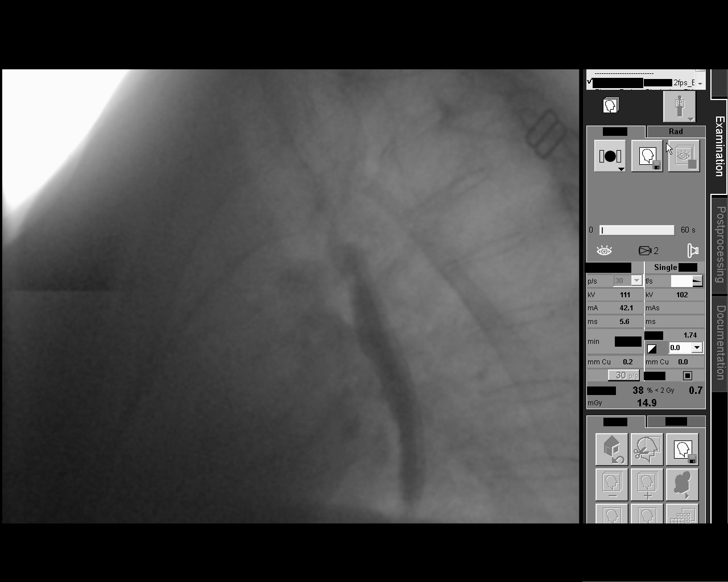

[Series 15: run · 1 of 675 frames shown]
[frame 574/675]
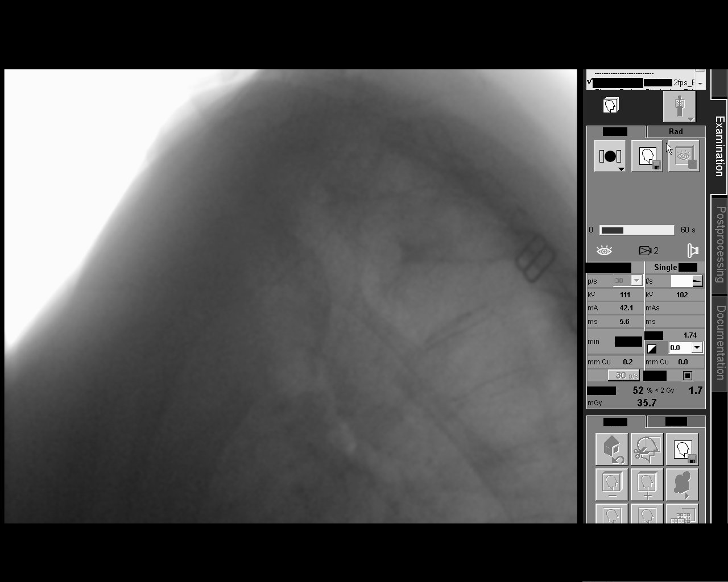

[Series 16: puree esophageal sweep · 1 of 271 frames shown]
[frame 231/271]
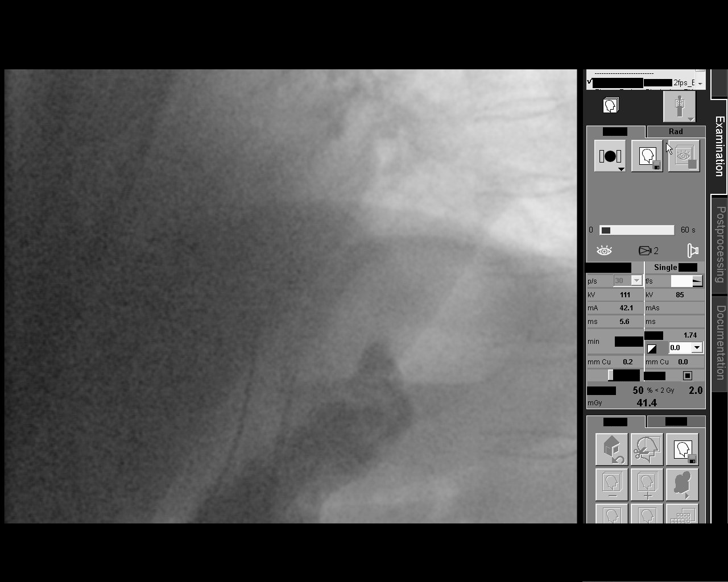

[13 of 24 positions shown; findings below may reference images not displayed]

FLUOROSCOPY FOR SWALLOWING FUNCTION STUDY:
Fluoroscopy was provided for swallowing function study, which was administered by a speech pathologist.  Final results and recommendations from this study are contained within the speech pathology report.

## 2017-12-06 ENCOUNTER — Encounter (INDEPENDENT_AMBULATORY_CARE_PROVIDER_SITE_OTHER): Payer: Self-pay

## 2017-12-26 ENCOUNTER — Ambulatory Visit (INDEPENDENT_AMBULATORY_CARE_PROVIDER_SITE_OTHER): Payer: BLUE CROSS/BLUE SHIELD | Admitting: Internal Medicine

## 2018-01-17 ENCOUNTER — Encounter (INDEPENDENT_AMBULATORY_CARE_PROVIDER_SITE_OTHER): Payer: Self-pay | Admitting: Internal Medicine

## 2018-01-17 ENCOUNTER — Ambulatory Visit (INDEPENDENT_AMBULATORY_CARE_PROVIDER_SITE_OTHER): Payer: BLUE CROSS/BLUE SHIELD | Admitting: Internal Medicine

## 2018-01-17 VITALS — BP 122/80 | HR 60 | Temp 97.6°F | Ht 66.0 in | Wt 150.3 lb

## 2018-01-17 DIAGNOSIS — K8050 Calculus of bile duct without cholangitis or cholecystitis without obstruction: Secondary | ICD-10-CM

## 2018-01-17 DIAGNOSIS — K219 Gastro-esophageal reflux disease without esophagitis: Secondary | ICD-10-CM | POA: Diagnosis not present

## 2018-01-17 LAB — HEPATIC FUNCTION PANEL
AG RATIO: 1.7 (calc) (ref 1.0–2.5)
ALKALINE PHOSPHATASE (APISO): 104 U/L (ref 33–130)
ALT: 18 U/L (ref 6–29)
AST: 21 U/L (ref 10–35)
Albumin: 4.3 g/dL (ref 3.6–5.1)
Bilirubin, Direct: 0.1 mg/dL (ref 0.0–0.2)
GLOBULIN: 2.5 g/dL (ref 1.9–3.7)
Indirect Bilirubin: 0.4 mg/dL (calc) (ref 0.2–1.2)
TOTAL PROTEIN: 6.8 g/dL (ref 6.1–8.1)
Total Bilirubin: 0.5 mg/dL (ref 0.2–1.2)

## 2018-01-17 NOTE — Progress Notes (Signed)
   Subjective:    Patient ID: Ashley Carter, female    DOB: [birth year], 62 y.o.   MRN: [id number]  HPI Here today for f/u. Hx of of sphincter of Odie dysfunction. She is status post biliary sphincterotomy x 2 at [facility] by Dr. Jackquline Denmark. She tells me she is doing okay. In March and April she had a rough time. She sometimes has a pain in her back which she thinks is SOD.  She says when she has the episodes, she will have IBS. She say she feels good today. Her appetite is okay for the most part.  She has lost 9 pounds since her last visit in April.  GERD controlled with the Nexium for the most part.  She is having a BM daily and sometimes she may go 4 times a day if she has IBS or SOD.  She underwent an EGD/ED in December of 2017 for dysphagiawhich revealed:The second portion of the duodenum was normal. Impression: - Normal esophagus. - No endoscopic esophageal abnormality to explain  patient's dysphagia. Esophagus dilated. Dilated. - Z-line regular, 36 cm from the incisors. - Gastritis. Biopsied. - The examination was otherwise normal. - Duodenitis. - Normal second portion of the duodenum. Biopsy negative for H. Pylori.   Review of Systems     Objective:   Physical Exam Blood pressure 122/80, pulse 60, temperature 97.6 F (36.4 C), height 5\' 6"  (1.676 m), weight 150 lb 4.8 oz (68.2 kg). Alert and oriented. Skin warm and dry. Oral mucosa is moist.   . Sclera anicteric, conjunctivae is pink. Thyroid not enlarged. No cervical lymphadenopathy. Lungs clear. Heart regular rate and rhythm.  Abdomen is soft. Bowel sounds are positive. No hepatomegaly. No abdominal masses felt. No tenderness.  No edema to lower extremities.          Assessment & Plan:  SOD: will get a  Hepatic today. GERD: She will continue the Nexium OV in 1 year.

## 2018-01-17 NOTE — Patient Instructions (Signed)
Labs today

## 2018-03-11 ENCOUNTER — Other Ambulatory Visit (INDEPENDENT_AMBULATORY_CARE_PROVIDER_SITE_OTHER): Payer: Self-pay | Admitting: Internal Medicine

## 2018-03-11 DIAGNOSIS — K219 Gastro-esophageal reflux disease without esophagitis: Secondary | ICD-10-CM

## 2018-05-07 ENCOUNTER — Other Ambulatory Visit (INDEPENDENT_AMBULATORY_CARE_PROVIDER_SITE_OTHER): Payer: Self-pay | Admitting: Internal Medicine

## 2018-09-02 IMAGING — US US ABDOMEN COMPLETE
1 series · 14 of 25 positions shown · non-contrast
Comparison: None.

CLINICAL DATA: Chronic upper abdominal pain. History of
cholecystectomy in 7886.

EXAM:
ABDOMEN ULTRASOUND COMPLETE

[Series 1: us abdomen complete · 14 of 92 slices shown]
[im 1/92]
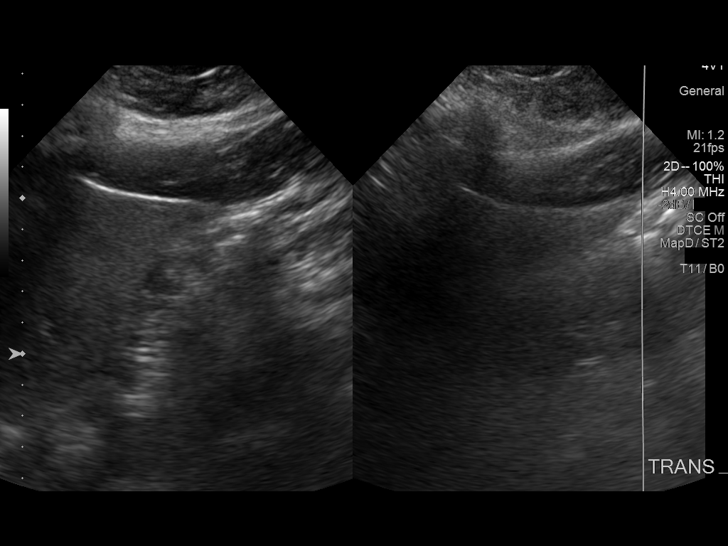
[im 8/92]
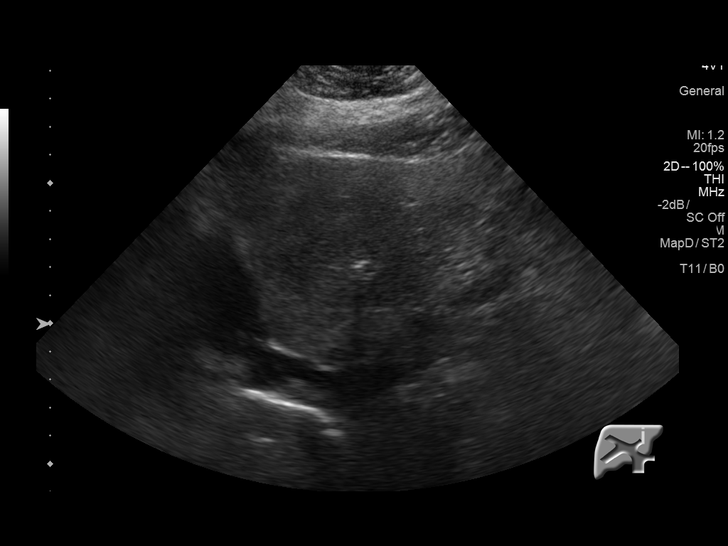
[im 16/92]
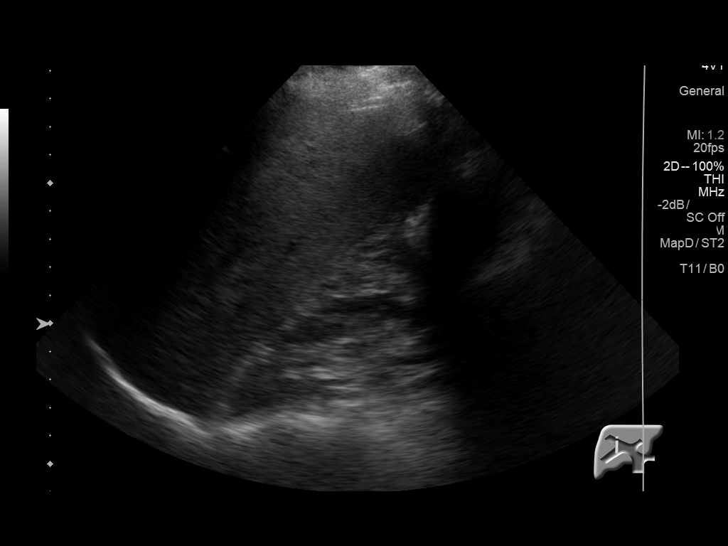
[im 23/92]
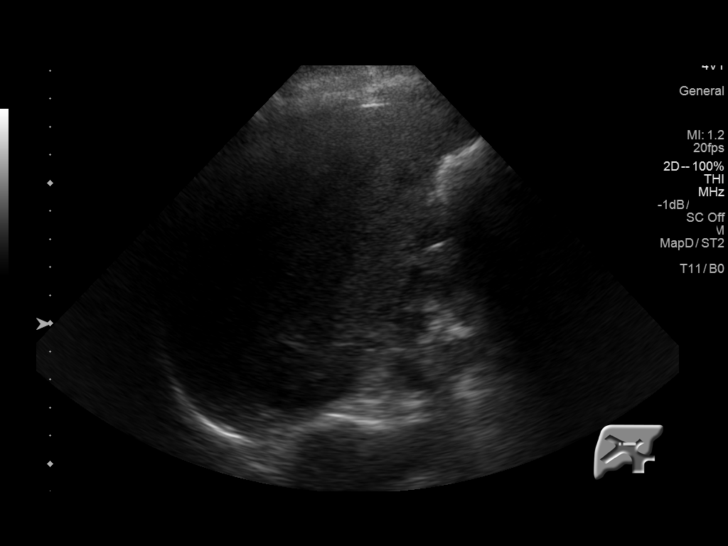
[im 31/92]
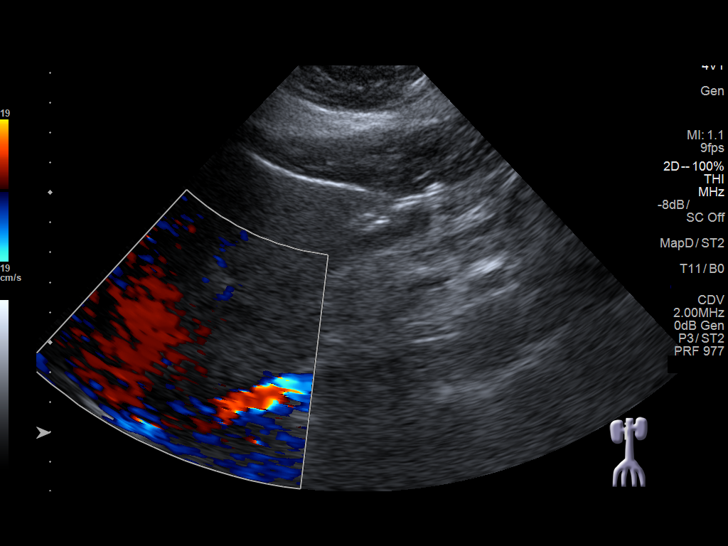
[im 35/92]
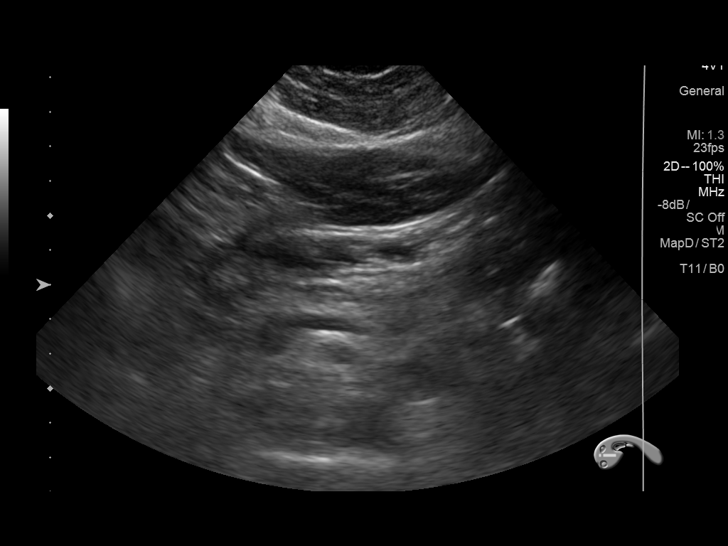
[im 42/92]
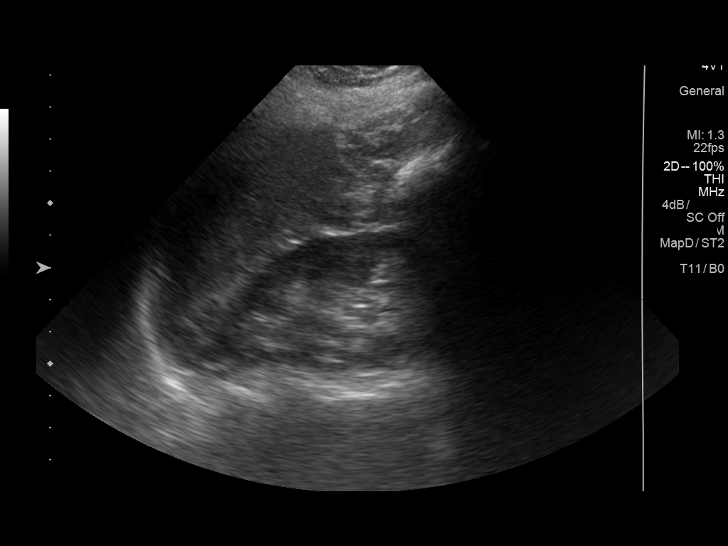
[im 50/92]
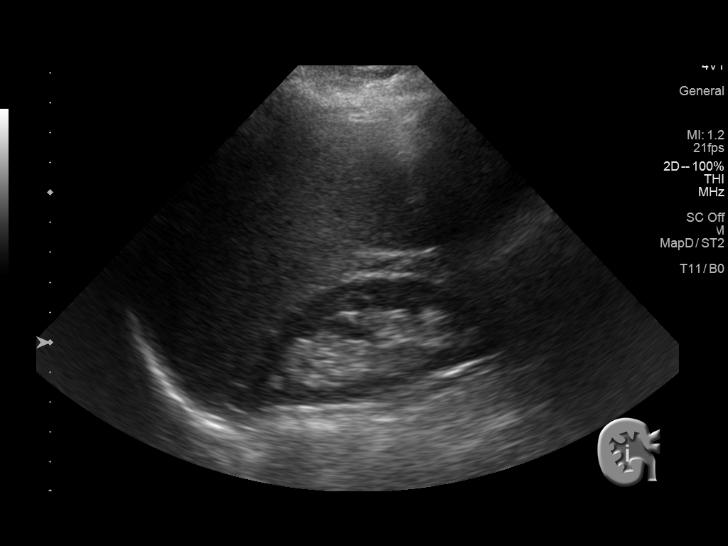
[im 57/92]
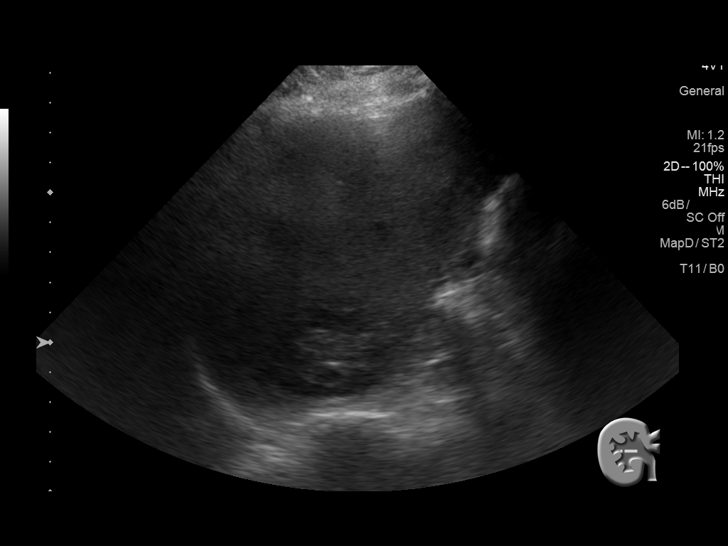
[im 61/92]
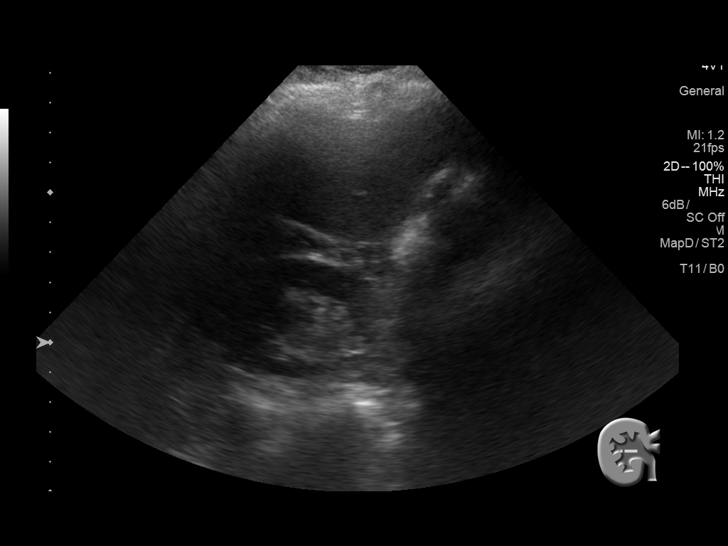
[im 69/92]
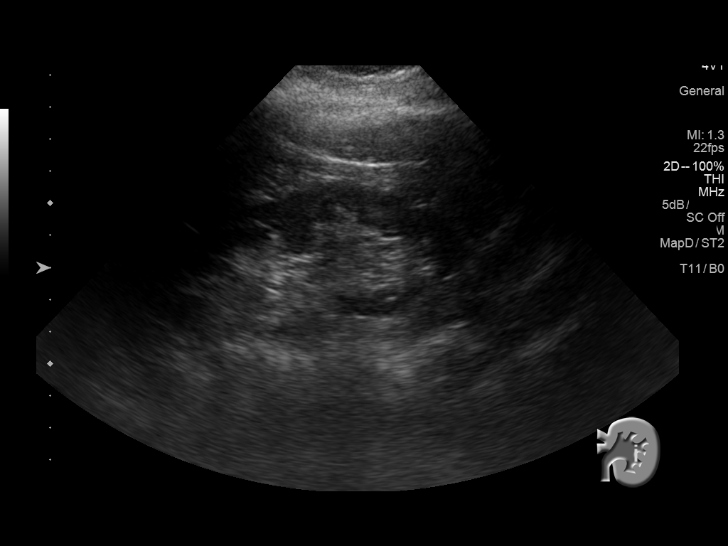
[im 76/92]
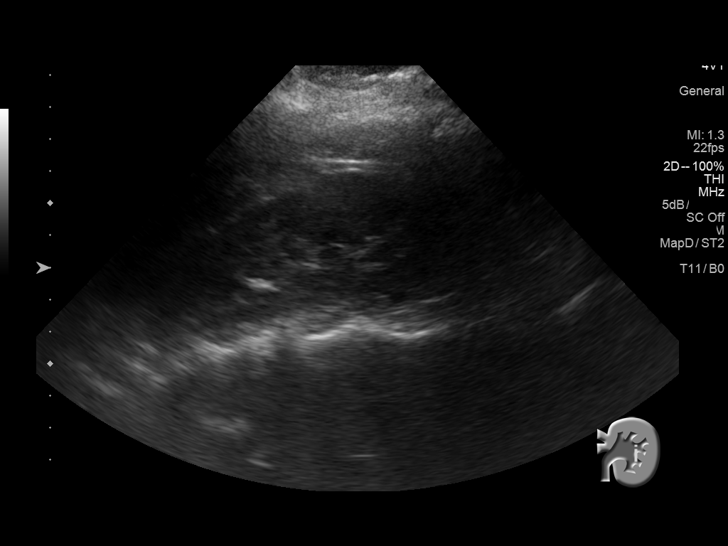
[im 84/92]
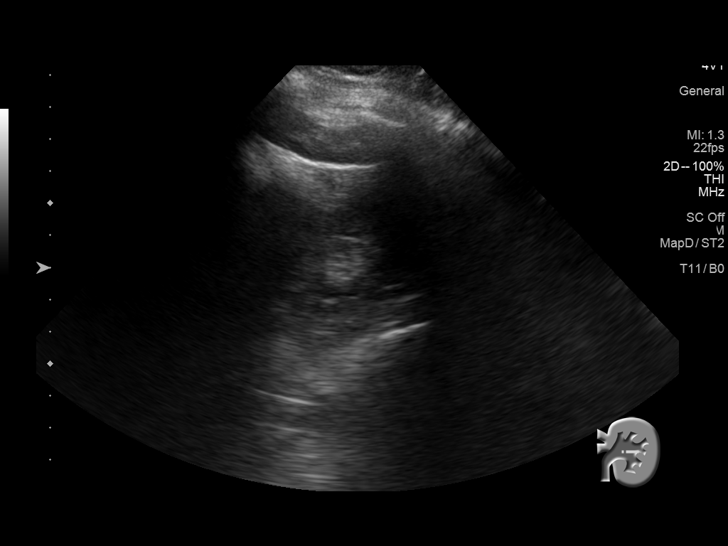
[im 92/92]
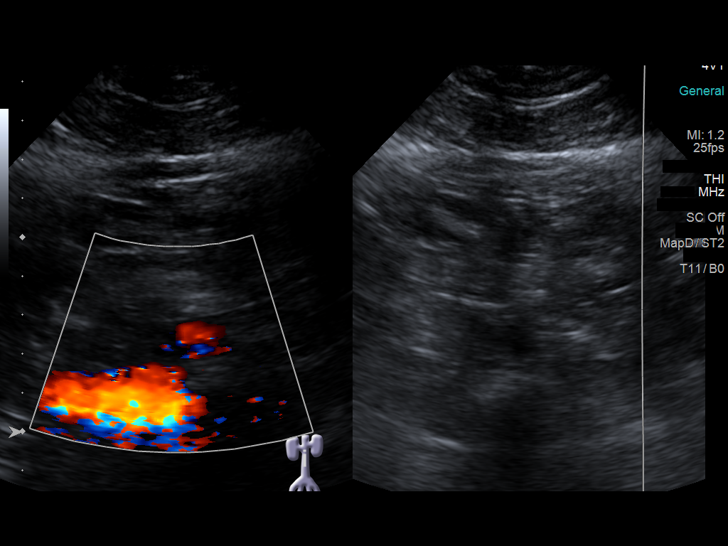

[14 of 25 positions shown; findings below may reference images not displayed]

FINDINGS: Gallbladder: Removed.

Common bile duct: Diameter: 0.3 cm.

Liver: No focal lesion. Echogenicity is somewhat increased. Portal
vein is patent on color Doppler imaging with normal direction of
blood flow towards the liver.

IVC: No abnormality visualized.

Pancreas: Visualized portion unremarkable.

Spleen: Size and appearance within normal limits.

Right Kidney: Length: 9.5 cm. Echogenicity within normal limits. No
mass or hydronephrosis visualized.

Left Kidney: Length: 10.1 cm. Echogenicity within normal limits. No
mass or hydronephrosis visualized.

Abdominal aorta: No aneurysm visualized.

Other findings: None.
IMPRESSION: Mild appearing fatty infiltration of the liver. The exam is
otherwise negative.

## 2019-04-03 ENCOUNTER — Other Ambulatory Visit (INDEPENDENT_AMBULATORY_CARE_PROVIDER_SITE_OTHER): Payer: Self-pay | Admitting: *Deleted

## 2019-04-03 DIAGNOSIS — K219 Gastro-esophageal reflux disease without esophagitis: Secondary | ICD-10-CM

## 2019-04-03 MED ORDER — ESOMEPRAZOLE MAGNESIUM 40 MG PO CPDR: DELAYED_RELEASE_CAPSULE | ORAL | 3 refills | Status: DC

## 2019-04-27 ENCOUNTER — Ambulatory Visit (INDEPENDENT_AMBULATORY_CARE_PROVIDER_SITE_OTHER): Payer: BLUE CROSS/BLUE SHIELD | Admitting: Internal Medicine

## 2019-04-28 ENCOUNTER — Encounter (INDEPENDENT_AMBULATORY_CARE_PROVIDER_SITE_OTHER): Payer: Self-pay

## 2019-05-04 ENCOUNTER — Ambulatory Visit (INDEPENDENT_AMBULATORY_CARE_PROVIDER_SITE_OTHER): Payer: BLUE CROSS/BLUE SHIELD | Admitting: Internal Medicine

## 2019-05-04 ENCOUNTER — Encounter (INDEPENDENT_AMBULATORY_CARE_PROVIDER_SITE_OTHER): Payer: Self-pay | Admitting: Internal Medicine

## 2019-05-04 ENCOUNTER — Other Ambulatory Visit: Payer: Self-pay

## 2019-05-04 VITALS — BP 127/78 | HR 78 | Temp 94.1°F | Ht 66.0 in | Wt 153.2 lb

## 2019-05-04 DIAGNOSIS — K8050 Calculus of bile duct without cholangitis or cholecystitis without obstruction: Secondary | ICD-10-CM | POA: Diagnosis not present

## 2019-05-04 DIAGNOSIS — K219 Gastro-esophageal reflux disease without esophagitis: Secondary | ICD-10-CM | POA: Diagnosis not present

## 2019-05-04 NOTE — Progress Notes (Addendum)
Presenting complaint;  Follow for chronic GERD and biliary colic.  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  She was begun on Urso few years ago with a thought that she may have microlithiasis.  She was last seen in October 2019.  Patient states she is doing quite well.  She may have heartburn no more more than 3-4 times in a month triggered by certain foods.  She denies regurgitation dysphagia.  She has intermittent hoarseness but it resolves on its own.  She denies sore throat or feeling of lump in her throat.  She remains with sporadic episodes of right upper quadrant abdominal pain.  She states these episodes do not occur every month.  Last significant episode was in September last year.  She says the pain was mild and it did not last long.  She is not having any side effects with PPI.  She states her bowels move every other day unless she gets a flareup with IBS and she has diarrhea.  The spells occur 2 or 3 times in a month.  She denies melena or rectal bleeding. 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  She had screening colonoscopy in February 2013 and no polyps were identified.  Last EGD was in December 2017 with esophageal dilation.  She has noted to have antral gastritis and duodenitis.  Gastric biopsy was negative for H. pylori infection.    Current Medications: Outpatient Encounter Medications as of 05/04/2019  Medication Sig  . Calcium Carbonate-Vit D-Min (CALCIUM 1200) 1200-1000 MG-UNIT CHEW Chew 1 tablet by mouth 2 (two) times daily.   Marland Kitchen CRANBERRY PO Take 25,000 Units by mouth 2 (two) times daily.   Marland Kitchen esomeprazole (NEXIUM) 40 MG capsule TAKE 1 CAPSULE BY MOUTH EVERY DAY IN THE MORNING  . ESTRACE VAGINAL 0.1 MG/GM vaginal cream Place 1 Applicatorful vaginally at  bedtime.   . hydrochlorothiazide (MICROZIDE) 12.5 MG capsule Take 12.5 mg by mouth daily.    Marland Kitchen loratadine (CLARITIN) 10 MG tablet Take 10 mg by mouth as needed for allergies.  . ursodiol (ACTIGALL) 500 MG tablet TAKE 1 TABLET BY MOUTH TWICE A DAY  . Cholecalciferol (VITAMIN D3) 1000 units CAPS Take 25 capsules by mouth.   . [DISCONTINUED] Loratadine (CLARITIN) 10 MG CAPS Take 10 mg by mouth daily.  . [DISCONTINUED] multivitamin-iron-minerals-folic acid (CENTRUM) chewable tablet Chew 1 tablet by mouth daily.   No facility-administered encounter medications on file as of 05/04/2019.     Objective: Blood pressure 127/78, pulse 78, temperature (!) 94.1 F (34.5 C), temperature source Temporal, height 5\' 6"  (1.676 m), weight 153 lb 3.2 oz (69.5 kg). Patient is alert and in no acute distress. She is wearing a facial mask. Conjunctiva is pink. Sclera is nonicteric Oropharyngeal mucosa is normal. No neck masses or thyromegaly noted. Cardiac exam with regular rhythm normal S1 and S2. No murmur or gallop noted. Lungs are clear to auscultation. Abdomen is symmetrical soft and nontender without organomegaly or masses. No LE edema or clubbing noted.  Labs/studies Results: LFTs from 02/10/2018 Bilirubin 0.5 AP 95, AST 30, ALT 25, total protein 6.7 albumin 4.3.  Lab data from 02/20/2019 provided by the patient WBC 5.02 H&H 13.3 and 42.4 Platelet count 301K  Bilirubin 0.4, AP 102, AST 21, ALT 12, total protein  6.9 and albumin 4.4. Serum calcium 10.0 Glucose 110, BUN 18 creatinine 0.62 Serum sodium 139, potassium 3.9, chloride 99, CO2 31.  Assessment:  #1.  Chronic GERD.  She is doing well with antireflux measures and PPI.  Now that she is retiring she should consider running dropping PPI dose.  #2.  History of biliary colic.  She has undergone biliary sphincterotomy and balloon dilation and remained with intermittent symptoms and bump in her transaminases.  Since she has been on Urso she  only has had milder episodes and frequency has decreased.  Therefore she will continue Urso.  #3.  History of IBS.  She is having very few episodes in a month.  She is up-to-date on screening for CRC.  Next screening colonoscopy would be in February 2023.  Plan:  Patient advised to try esomeprazole 40 mg every other day beginning April or May this year.  If if needed she can take famotidine OTC 20 mg on off days. If PPI every other day does not work she can go back to taking it every day. Patient will call if she has another episode of significant right upper quadrant abdominal pain is with case would consider LFTs. Office visit in 1 year.

## 2019-05-04 NOTE — Patient Instructions (Signed)
Consider taking Nexium/esomeprazole every other days starting in April or May.  If needed can take famotidine OTC 20 mg on off days.  If every other day schedule does not control symptoms can go back to Nexium/esomeprazole. Talk with primary care physician about getting bone density study.

## 2019-06-04 ENCOUNTER — Other Ambulatory Visit (INDEPENDENT_AMBULATORY_CARE_PROVIDER_SITE_OTHER): Payer: Self-pay | Admitting: Internal Medicine

## 2020-03-28 ENCOUNTER — Encounter (INDEPENDENT_AMBULATORY_CARE_PROVIDER_SITE_OTHER): Payer: Self-pay

## 2020-03-29 ENCOUNTER — Other Ambulatory Visit (INDEPENDENT_AMBULATORY_CARE_PROVIDER_SITE_OTHER): Payer: Self-pay | Admitting: Internal Medicine

## 2020-03-29 DIAGNOSIS — K219 Gastro-esophageal reflux disease without esophagitis: Secondary | ICD-10-CM

## 2020-05-03 ENCOUNTER — Ambulatory Visit (INDEPENDENT_AMBULATORY_CARE_PROVIDER_SITE_OTHER): Payer: BC Managed Care – PPO | Admitting: Internal Medicine

## 2020-05-09 ENCOUNTER — Encounter (INDEPENDENT_AMBULATORY_CARE_PROVIDER_SITE_OTHER): Payer: Self-pay | Admitting: Internal Medicine

## 2020-05-09 ENCOUNTER — Telehealth (INDEPENDENT_AMBULATORY_CARE_PROVIDER_SITE_OTHER): Payer: Medicare Other | Admitting: Internal Medicine

## 2020-05-09 ENCOUNTER — Other Ambulatory Visit: Payer: Self-pay

## 2020-05-09 VITALS — BP 104/65 | HR 73 | Ht 66.0 in | Wt 134.4 lb

## 2020-05-09 DIAGNOSIS — K8050 Calculus of bile duct without cholangitis or cholecystitis without obstruction: Secondary | ICD-10-CM

## 2020-05-09 DIAGNOSIS — K219 Gastro-esophageal reflux disease without esophagitis: Secondary | ICD-10-CM

## 2020-05-09 MED ORDER — URSODIOL 500 MG PO TABS: 500.0000 mg | ORAL_TABLET | Freq: Two times a day (BID) | ORAL | 3 refills | Status: DC

## 2020-05-09 NOTE — Progress Notes (Addendum)
Virtual Visit via Telephone Note  I connected with Ashley Carter on 05/09/20 at  3:08 PM EST by telephone and verified that I am speaking with the correct person using two identifiers.  Location: Patient: home Provider: office   I discussed the limitations, risks, security and privacy concerns of performing an evaluation and management service by telephone and the availability of in person appointments. I also discussed with the patient that there may be a patient responsible charge related to this service. The patient expressed understanding and agreed to proceed.   History of Present Illness:  Patient is 65 year old Caucasian female who has a history of sphincter of Oddi dysfunction status post 2 ERCPs(first 1 with sphincterotomy and second 1 with balloon dilation of biliary sphincter).  Second ERCP was at [facility] in February 2007.  She also has history of GERD. Patient was last seen 1 year ago.  Patient says heartburn is well controlled with daily Nexium.  She did try taking Nexium every other day.  She tried Pepcid on off days.  She made this change back in May 2021.  In October she developed nasty and bitter taste when she would wake up in the morning.  This symptom will resolve by noon time.  She also began to experience spitting up blood and noted blood-tinged saliva on her pillow.  She was found to have what sounds like glossitis.  She went back to Nexium daily and her symptoms gradually improved.  She is not having heartburn anymore.  On some mornings she wakes up with sore throat but she feels it is because of dry mouth as she is a mouth breather.  Her appetite is good but she has been watching her diet.  She has lost 20 pounds since her last visit.  She says weight loss is voluntary.  She says she was tested positive for rheumatoid factor but she has not been diagnosed with any autoimmune disorder. She was evaluated by neurologist when she developed numbness in left hand and fingers.   Head CT and carotid studies were negative.  She was found to have low B12 level and begun on B12.  She does not remember the value.  She continues to experience right upper quadrant pain.  She says she has had 4-5 episodes in the last 1 year.  She is to have them much more frequently.  Last episode was about 2 weeks ago and lasted for 2 to 3 minutes.  She feels severity and frequency of episodes has decreased since she has been on Urso.  She is not having any side effects with Urso.  Her bowels move regularly.  She denies melena or rectal bleeding. Her last screening colonoscopy was in January 2013. She had esophagogastroduodenoscopy with esophageal dilation in December 2017.  She also had gastritis with biopsy was negative for H. pylori infection.   Current Outpatient Medications:  .  Calcium Carbonate-Vit D-Min (CALCIUM 1200) 1200-1000 MG-UNIT CHEW, Chew 1 tablet by mouth 2 (two) times daily. Patient reports that this is soft capsules., Disp: , Rfl:  .  esomeprazole (NEXIUM) 40 MG capsule, TAKE 1 CAPSULE BY MOUTH EVERY DAY IN THE MORNING, Disp: 90 capsule, Rfl: 3 .  ESTRACE VAGINAL 0.1 MG/GM vaginal cream, Place 1 Applicatorful vaginally at bedtime. , Disp: , Rfl:  .  hydrochlorothiazide (MICROZIDE) 12.5 MG capsule, Take 12.5 mg by mouth daily., Disp: , Rfl:  .  loratadine (CLARITIN) 10 MG tablet, Take 10 mg by mouth as needed for allergies., Disp: ,  Rfl:  .  sodium fluoride (PREVIDENT 5000 PLUS) 1.1 % CREA dental cream, Place 1 application onto teeth at bedtime., Disp: , Rfl:  .  ursodiol (ACTIGALL) 500 MG tablet, Take 1 tablet (500 mg total) by mouth 2 (two) times daily., Disp: 180 tablet, Rfl: 3 .  VITAMIN B1-B12 IJ, Inject as directed. Patient will START this soon. It will be injected weekly for 4 weeks , then it will be monthly. (Patient not taking: Reported on 05/09/2020), Disp: , Rfl:      Observations/Objective:  Patient reported her weight to be 134 pounds. She weighed 153 pounds on  05/04/2019.  Assessment and Plan:  #1.  Chronic GERD.  She is doing well with dietary measures and single dose PPI.  She did not do well with PPI alternating with H2B.  She had EGD in December 2017 and did not have Barrett's esophagus May try low-dose PPI daily at some point in future.  #2.  History of biliary colic.  She has undergone 2 therapeutic ERCPs.  Most recent ERCP was in 2007.  She has done well since she was placed on Urso raising the possibility of microlithiasis.  Will continue with this therapy as long as it is working. Prescription for Urso sent to patient's pharmacy for 71-month supply with 3 refills.  #3.  Patient is average risk for CRC.  Next colonoscopy due and January 2023.  Follow Up Instructions:  Office visit in 1 year.  I discussed the assessment and treatment plan with the patient. The patient was provided an opportunity to ask questions and all were answered. The patient agreed with the plan and demonstrated an understanding of the instructions.   The patient was advised to call back or seek an in-person evaluation if the symptoms worsen or if the condition fails to improve as anticipated.  I provided 17 minutes of non-face-to-face time during this encounter. This time does not include time spent in documentation and review of records.   Hildred Laser, MD

## 2021-05-15 ENCOUNTER — Encounter (INDEPENDENT_AMBULATORY_CARE_PROVIDER_SITE_OTHER): Payer: Self-pay | Admitting: *Deleted

## 2021-07-31 ENCOUNTER — Encounter (INDEPENDENT_AMBULATORY_CARE_PROVIDER_SITE_OTHER): Payer: Self-pay | Admitting: Gastroenterology

## 2021-07-31 ENCOUNTER — Ambulatory Visit (INDEPENDENT_AMBULATORY_CARE_PROVIDER_SITE_OTHER): Payer: Medicare Other | Admitting: Gastroenterology

## 2021-07-31 VITALS — BP 138/80 | HR 85 | Temp 97.9°F | Ht 64.5 in | Wt 132.2 lb

## 2021-07-31 DIAGNOSIS — R1310 Dysphagia, unspecified: Secondary | ICD-10-CM

## 2021-07-31 NOTE — Progress Notes (Signed)
[name], M.D. ?Gastroenterology & Hepatology ?[facility] ?[address] ?[place], [place] [address] ? ?Primary Care Physician: ?Tillman Abide, FNP ?[address] ?[place] [address] ? ?I will communicate my assessment and recommendations to the referring MD via EMR. ? ?Problems: ?Chronic dysphagia ? ?History of Present Illness: ?Ashley Carter is a 66 y.o. 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 ? ?The patient was last seen on 05/04/2019. At that time, the patient was advised to continue taking esomeprazole 40 mg every other day for GERD. ? ?She reports a chronic history of dysphagia for multiple years. She describes that the dysphagia is "uncomfortable" although she does not feel it has been getting worse.  She had a S/S evaluation in 01/2016 showing possible narrowing at the GE junction but no oropharyngeal abnormalities, which was followed by an Egd described below. She believes she may have improved with previous EGD but is not sure for how long it lasts. ? ?She reports every time she swallows she has some choking sensation. She tries to chew her food very thoroughly. She states her dysphagia is worse with liquids than with solids.  She has both a sensation of choking followed by coughing but no presence of dysphagia.  She believes she may "have a small oral cavity " which could aggravate her symptoms. ? ?She has decreased her ursodiol to once a dayas she was having constipation with higher doses. ? ?The patient denies having any heartburn, nausea, vomiting, fever, chills, hematochezia, melena, hematemesis, abdominal distention, abdominal pain, diarrhea, jaundice, pruritus or weight loss. ? ?Last EGD: 03/23/2016  ?- Normal  esophagus. ?- No endoscopic esophageal abnormality to explain patient's dysphagia. Esophagus dilated. ?Dilated. ?- Z-line regular, 36 cm from the incisors. ?- Gastritis. Biopsied - reactive gastropathy, neg for HP. ?- The examination was otherwise normal. ?- Duodenitis. ?- Normal second portion of the duodenum. ? ?Last Colonoscopy: 05/2011  ?normal ? ?Past Medical History: ?Past Medical History:  ?Diagnosis Date  ? Arthritis   ? GERD (gastroesophageal reflux disease)   ? Hemorrhoid   ? High cholesterol   ? IBS (irritable bowel syndrome)   ? ? ?Past Surgical History: ?Past Surgical History:  ?Procedure Laterality Date  ? BIOPSY  03/23/2016  ? Procedure: BIOPSY;  Surgeon: Rogene Houston, MD;  Location: [facility];  Service: Endoscopy;;  gastric bx  ? CHOLECYSTECTOMY    ? 1993  ? COLONOSCOPY  05/31/2011  ? Procedure: COLONOSCOPY;  Surgeon: Rogene Houston, MD;  Location: [facility];  Service: Endoscopy;  Laterality: N/A;  1200  ? ESOPHAGEAL DILATION N/A 03/23/2016  ? Procedure: ESOPHAGEAL DILATION;  Surgeon: Rogene Houston, MD;  Location: [facility];  Service: Endoscopy;  Laterality: N/A;  ? ESOPHAGOGASTRODUODENOSCOPY N/A 03/23/2016  ? Procedure: ESOPHAGOGASTRODUODENOSCOPY (EGD);  Surgeon: Rogene Houston, MD;  Location: [facility];  Service: Endoscopy;  Laterality: N/A;  1:50  ? RECTO-VAGINAL FISSURE REPAIR  x 2  ? after child birth  ? spincter of oddi dysfunction    ? repair x2  ? ? ?Family History: ?Family History  ?Problem Relation Age of Onset  ? Anesthesia problems Neg Hx   ? Hypotension Neg Hx   ? Malignant hyperthermia Neg Hx   ? Pseudochol deficiency Neg Hx   ? Colon cancer  Neg Hx   ? ? ?Social History: ?Social History  ? ?Tobacco Use  ?Smoking Status Never  ?Smokeless Tobacco Never  ? ?Social History  ? ?Substance and Sexual Activity  ?Alcohol Use No  ? ?Social History  ? ?Substance and Sexual Activity  ?Drug Use No  ? ? ?Allergies: ?Allergies  ?Allergen Reactions  ? Kiwi Extract Anaphylaxis and  Swelling  ?  Swelling of the throat  ? Other Anaphylaxis and Swelling  ?  English walnuts  ? Morphine And Related Nausea And Vomiting  ?  Dry heaves   ? Vibramycin [Doxycycline Calcium] Other (See Comments)  ?  Ulcers ?  ? ? ?Medications: ?Current Outpatient Medications  ?Medication Sig Dispense Refill  ? Calcium Carbonate-Vit D-Min (CALCIUM 1200) 1200-1000 MG-UNIT CHEW Chew 1 tablet by mouth 2 (two) times daily. Patient reports that this is soft capsules.    ? esomeprazole (NEXIUM) 40 MG capsule TAKE 1 CAPSULE BY MOUTH EVERY DAY IN THE MORNING 90 capsule 3  ? GLUCOSAMINE-CALCIUM-VIT D PO Take by mouth in the morning and at bedtime.    ? hydrochlorothiazide (MICROZIDE) 12.5 MG capsule Take 12.5 mg by mouth daily.    ? Lactobacillus-Inulin (CULTURELLE DIGESTIVE DAILY PO) Take by mouth daily at 6 (six) AM.    ? loratadine (CLARITIN) 10 MG tablet Take 10 mg by mouth as needed for allergies.    ? Multiple Vitamin (MULTIVITAMIN) capsule Take 1 capsule by mouth daily.    ? OVER THE COUNTER MEDICATION daily at 6 (six) AM. Jonette Eva UTI prevention daily.    ? sodium fluoride (PREVIDENT 5000 PLUS) 1.1 % CREA dental cream Place 1 application onto teeth at bedtime.    ? ursodiol (ACTIGALL) 500 MG tablet Take 1 tablet (500 mg total) by mouth 2 (two) times daily. 180 tablet 3  ? VITAMIN B1-B12 IJ Inject as directed. Patient will START this soon. ?It will be injected weekly for 4 weeks , then it will be monthly.    ? ?No current facility-administered medications for this visit.  ? ? ?Review of Systems: ?GENERAL: negative for malaise, night sweats ?HEENT: No changes in hearing or vision, no nose bleeds or other nasal problems. ?NECK: Negative for lumps, goiter, pain and significant neck swelling ?RESPIRATORY: Negative for cough, wheezing ?CARDIOVASCULAR: Negative for chest pain, leg swelling, palpitations, orthopnea ?GI: SEE HPI ?MUSCULOSKELETAL: Negative for joint pain or swelling, back pain, and muscle pain. ?SKIN: Negative for  lesions, rash ?PSYCH: Negative for sleep disturbance, mood disorder and recent psychosocial stressors. ?HEMATOLOGY Negative for prolonged bleeding, bruising easily, and swollen nodes. ?ENDOCRINE: Negative for cold or heat intolerance, polyuria, polydipsia and goiter. ?NEURO: negative for tremor, gait imbalance, syncope and seizures. ?The remainder of the review of systems is noncontributory. ? ? ?Physical Exam: ?BP 138/80 (BP Location: Left Arm, Patient Position: Sitting, Cuff Size: Small)   Pulse 85   Temp 97.9 ?F (36.6 ?C) (Oral)   Ht 5' 4.5" (1.638 m)   Wt 132 lb 3.2 oz (60 kg)   BMI 22.34 kg/m?  ?GENERAL: The patient is AO x3, in no acute distress. ?HEENT: Head is normocephalic and atraumatic. EOMI are intact. Mouth is well hydrated and without lesions. ?NECK: Supple. No masses ?LUNGS: Clear to auscultation. No presence of rhonchi/wheezing/rales. Adequate chest expansion ?HEART: RRR, normal s1 and s2. ?ABDOMEN: Soft, nontender, no guarding, no peritoneal signs, and nondistended. BS +. No masses. ?EXTREMITIES: Without any cyanosis, clubbing, rash, lesions or edema. ?NEUROLOGIC: AOx3, no focal motor deficit. ?SKIN: no jaundice, no  rashes ? ?Imaging/Labs: ?as above ? ?I personally reviewed and interpreted the available labs, imaging and endoscopic files. ? ?Impression and Plan: ?Ashley Carter is a 66 y.o. 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  Patient has presented recurrent episodes of dysphagia, especially with liquids.  She has had previous modified barium swallow that was unremarkable for any oropharyngeal abnormalities.  It is unclear if she had improvement with a dilation performed in the past.  However, given persistence of symptoms we will explore her symptoms further with a with  possible dilation.  However, if there is no improvement of her symptoms we will need to consider repeat modified barium swallow as she is presenting mostly symptoms in her throat which make me concerned about an oropharyng

## 2021-07-31 NOTE — Patient Instructions (Signed)
Schedule EGD and colonoscopy ?If no improvement with dilation, will consider repeat evaluation with [facility]BS ?

## 2021-08-03 ENCOUNTER — Telehealth (INDEPENDENT_AMBULATORY_CARE_PROVIDER_SITE_OTHER): Payer: Self-pay

## 2021-08-03 ENCOUNTER — Encounter (INDEPENDENT_AMBULATORY_CARE_PROVIDER_SITE_OTHER): Payer: Self-pay

## 2021-08-03 ENCOUNTER — Other Ambulatory Visit (INDEPENDENT_AMBULATORY_CARE_PROVIDER_SITE_OTHER): Payer: Self-pay

## 2021-08-03 DIAGNOSIS — I10 Essential (primary) hypertension: Secondary | ICD-10-CM

## 2021-08-03 MED ORDER — PEG 3350-KCL-NA BICARB-NACL 420 G PO SOLR: 4000.0000 mL | ORAL | 0 refills | Status: DC

## 2021-08-03 NOTE — Telephone Encounter (Signed)
Ashley Carter Ashley Carter, CMA  ?

## 2021-09-12 ENCOUNTER — Other Ambulatory Visit (INDEPENDENT_AMBULATORY_CARE_PROVIDER_SITE_OTHER): Payer: Self-pay

## 2021-09-12 DIAGNOSIS — K219 Gastro-esophageal reflux disease without esophagitis: Secondary | ICD-10-CM

## 2021-09-12 MED ORDER — ESOMEPRAZOLE MAGNESIUM 40 MG PO CPDR: DELAYED_RELEASE_CAPSULE | ORAL | 3 refills | Status: DC

## 2021-09-29 NOTE — Patient Instructions (Addendum)
DALANIE KISNER  09/29/2021     '@PREFPERIOPPHARMACY'$ @   Your procedure is scheduled on  10/04/2021.   Report to [facility] at  0600 A.M.   Call this number if you have problems the morning of surgery:  [phone]   Remember:  Follow the diet and prep instructions given to you by the office.    Take these medicines the morning of surgery with A SIP OF WATER                     nexium, actigall, claritin.     Do not wear jewelry, make-up or nail polish.  Do not wear lotions, powders, or perfumes, or deodorant.  Do not shave 48 hours prior to surgery.  Men may shave face and neck.  Do not bring valuables to the hospital.  [facility] is not responsible for any belongings or valuables.  Contacts, dentures or bridgework may not be worn into surgery.  Leave your suitcase in the car.  After surgery it may be brought to your room.  For patients admitted to the hospital, discharge time will be determined by your treatment team.  Patients discharged the day of surgery will not be allowed to drive home and must have someone with them for 24 hours.    Special instructions:   DO NOT smoke tobacco or vape for 24 hours before your procedure.  Please read over the following fact sheets that you were given. Anesthesia Post-op Instructions and Care and Recovery After Surgery       Upper Endoscopy, Adult, Care After This sheet gives you information about how to care for yourself after your procedure. Your health care provider may also give you more specific instructions. If you have problems or questions, contact your health care provider. What can I expect after the procedure? After the procedure, it is common to have: A sore throat. Mild stomach pain or discomfort. Bloating. Nausea. Follow these instructions at home:  Follow instructions from your health care provider about what to eat or drink after your procedure. Return to your normal activities as told by  your health care provider. Ask your health care provider what activities are safe for you. Take over-the-counter and prescription medicines only as told by your health care provider. If you were given a sedative during the procedure, it can affect you for several hours. Do not drive or operate machinery until your health care provider says that it is safe. Keep all follow-up visits as told by your health care provider. This is important. Contact a health care provider if you have: A sore throat that lasts longer than one day. Trouble swallowing. Get help right away if: You vomit blood or your vomit looks like coffee grounds. You have: A fever. Bloody, black, or tarry stools. A severe sore throat or you cannot swallow. Difficulty breathing. Severe pain in your chest or abdomen. Summary After the procedure, it is common to have a sore throat, mild stomach discomfort, bloating, and nausea. If you were given a sedative during the procedure, it can affect you for several hours. Do not drive or operate machinery until your health care provider says that it is safe. Follow instructions from your health care provider about what to eat or drink after your procedure. Return to your normal activities as told by your health care provider. This information is not intended to replace advice given to you by your  health care provider. Make sure you discuss any questions you have with your health care provider. Document Revised: 02/06/2019 Document Reviewed: 09/02/2017 Elsevier Patient Education  [place]. Esophageal Dilatation Esophageal dilatation, also called esophageal dilation, is a procedure to widen or open a blocked or narrowed part of the esophagus. The esophagus is the part of the body that moves food and liquid from the mouth to the stomach. You may need this procedure if: You have a buildup of scar tissue in your esophagus that makes it difficult, painful, or impossible to swallow. This  can be caused by gastroesophageal reflux disease (GERD). You have cancer of the esophagus. There is a problem with how food moves through your esophagus. In some cases, you may need this procedure repeated at a later time to dilate the esophagus gradually. Tell a health care provider about: Any allergies you have. All medicines you are taking, including vitamins, herbs, eye drops, creams, and over-the-counter medicines. Any problems you or family members have had with anesthetic medicines. Any blood disorders you have. Any surgeries you have had. Any medical conditions you have. Any antibiotic medicines you are required to take before dental procedures. Whether you are pregnant or may be pregnant. What are the risks? Generally, this is a safe procedure. However, problems may occur, including: Bleeding due to a tear in the lining of the esophagus. A hole, or perforation, in the esophagus. What happens before the procedure? Ask your health care provider about: Changing or stopping your regular medicines. This is especially important if you are taking diabetes medicines or blood thinners. Taking medicines such as aspirin and ibuprofen. These medicines can thin your blood. Do not take these medicines unless your health care provider tells you to take them. Taking over-the-counter medicines, vitamins, herbs, and supplements. Follow instructions from your health care provider about eating or drinking restrictions. Plan to have a responsible adult take you home from the hospital or clinic. Plan to have a responsible adult care for you for the time you are told after you leave the hospital or clinic. This is important. What happens during the procedure? You may be given a medicine to help you relax (sedative). A numbing medicine may be sprayed into the back of your throat, or you may gargle the medicine. Your health care provider may perform the dilatation using various surgical instruments, such  as: Simple dilators. This instrument is carefully placed in the esophagus to stretch it. Guided wire bougies. This involves using an endoscope to insert a wire into the esophagus. A dilator is passed over this wire to enlarge the esophagus. Then the wire is removed. Balloon dilators. An endoscope with a small balloon is inserted into the esophagus. The balloon is inflated to stretch the esophagus and open it up. The procedure may vary among health care providers and hospitals. What can I expect after the procedure? Your blood pressure, heart rate, breathing rate, and blood oxygen level will be monitored until you leave the hospital or clinic. Your throat may feel slightly sore and numb. This will get better over time. You will not be allowed to eat or drink until your throat is no longer numb. When you are able to drink, urinate, and sit on the edge of the bed without nausea or dizziness, you may be able to return home. Follow these instructions at home: Take over-the-counter and prescription medicines only as told by your health care provider. If you were given a sedative during the procedure, it can  affect you for several hours. Do not drive or operate machinery until your health care provider says that it is safe. Plan to have a responsible adult care for you for the time you are told. This is important. Follow instructions from your health care provider about any eating or drinking restrictions. Do not use any products that contain nicotine or tobacco, such as cigarettes, e-cigarettes, and chewing tobacco. If you need help quitting, ask your health care provider. Keep all follow-up visits. This is important. Contact a health care provider if: You have a fever. You have pain that is not relieved by medicine. Get help right away if: You have chest pain. You have trouble breathing. You have trouble swallowing. You vomit blood. You have black, tarry, or bloody stools. These symptoms may  represent a serious problem that is an emergency. Do not wait to see if the symptoms will go away. Get medical help right away. Call your local emergency services (911 in the [place] Do not drive yourself to the hospital. Summary Esophageal dilatation, also called esophageal dilation, is a procedure to widen or open a blocked or narrowed part of the esophagus. Plan to have a responsible adult take you home from the hospital or clinic. For this procedure, a numbing medicine may be sprayed into the back of your throat, or you may gargle the medicine. Do not drive or operate machinery until your health care provider says that it is safe. This information is not intended to replace advice given to you by your health care provider. Make sure you discuss any questions you have with your health care provider. Document Revised: 08/19/2019 Document Reviewed: 08/19/2019 Elsevier Patient Education  [place]. Colonoscopy, Adult, Care After The following information offers guidance on how to care for yourself after your procedure. Your health care provider may also give you more specific instructions. If you have problems or questions, contact your health care provider. What can I expect after the procedure? After the procedure, it is common to have: A small amount of blood in your stool for 24 hours after the procedure. Some gas. Mild cramping or bloating of your abdomen. Follow these instructions at home: Eating and drinking  Drink enough fluid to keep your urine pale yellow. Follow instructions from your health care provider about eating or drinking restrictions. Resume your normal diet as told by your health care provider. Avoid heavy or fried foods that are hard to digest. Activity Rest as told by your health care provider. Avoid sitting for a long time without moving. Get up to take short walks every 1-2 hours. This is important to improve blood flow and breathing. Ask for help if you feel  weak or unsteady. Return to your normal activities as told by your health care provider. Ask your health care provider what activities are safe for you. Managing cramping and bloating  Try walking around when you have cramps or feel bloated. If directed, apply heat to your abdomen as told by your health care provider. Use the heat source that your health care provider recommends, such as a moist heat pack or a heating pad. Place a towel between your skin and the heat source. Leave the heat on for 20-30 minutes. Remove the heat if your skin turns bright red. This is especially important if you are unable to feel pain, heat, or cold. You have a greater risk of getting burned. General instructions If you were given a sedative during the procedure, it can affect you  for several hours. Do not drive or operate machinery until your health care provider says that it is safe. For the first 24 hours after the procedure: Do not sign important documents. Do not drink alcohol. Do your regular daily activities at a slower pace than normal. Eat soft foods that are easy to digest. Take over-the-counter and prescription medicines only as told by your health care provider. Keep all follow-up visits. This is important. Contact a health care provider if: You have blood in your stool 2-3 days after the procedure. Get help right away if: You have more than a small spotting of blood in your stool. You have large blood clots in your stool. You have swelling of your abdomen. You have nausea or vomiting. You have a fever. You have increasing pain in your abdomen that is not relieved with medicine. These symptoms may be an emergency. Get help right away. Call 911. Do not wait to see if the symptoms will go away. Do not drive yourself to the hospital. Summary After the procedure, it is common to have a small amount of blood in your stool. You may also have mild cramping and bloating of your abdomen. If you were  given a sedative during the procedure, it can affect you for several hours. Do not drive or operate machinery until your health care provider says that it is safe. Get help right away if you have a lot of blood in your stool, nausea or vomiting, a fever, or increased pain in your abdomen. This information is not intended to replace advice given to you by your health care provider. Make sure you discuss any questions you have with your health care provider. Document Revised: 11/23/2020 Document Reviewed: 11/23/2020 Elsevier Patient Education  [place] After This sheet gives you information about how to care for yourself after your procedure. Your health care provider may also give you more specific instructions. If you have problems or questions, contact your health care provider. What can I expect after the procedure? After the procedure, it is common to have: Tiredness. Forgetfulness about what happened after the procedure. Impaired judgment for important decisions. Nausea or vomiting. Some difficulty with balance. Follow these instructions at home: For the time period you were told by your health care provider:     Rest as needed. Do not participate in activities where you could fall or become injured. Do not drive or use machinery. Do not drink alcohol. Do not take sleeping pills or medicines that cause drowsiness. Do not make important decisions or sign legal documents. Do not take care of children on your own. Eating and drinking Follow the diet that is recommended by your health care provider. Drink enough fluid to keep your urine pale yellow. If you vomit: Drink water, juice, or soup when you can drink without vomiting. Make sure you have little or no nausea before eating solid foods. General instructions Have a responsible adult stay with you for the time you are told. It is important to have someone help care for you until you are  awake and alert. Take over-the-counter and prescription medicines only as told by your health care provider. If you have sleep apnea, surgery and certain medicines can increase your risk for breathing problems. Follow instructions from your health care provider about wearing your sleep device: Anytime you are sleeping, including during daytime naps. While taking prescription pain medicines, sleeping medicines, or medicines that make you drowsy. Avoid smoking. Keep all  follow-up visits as told by your health care provider. This is important. Contact a health care provider if: You keep feeling nauseous or you keep vomiting. You feel light-headed. You are still sleepy or having trouble with balance after 24 hours. You develop a rash. You have a fever. You have redness or swelling around the IV site. Get help right away if: You have trouble breathing. You have new-onset confusion at home. Summary For several hours after your procedure, you may feel tired. You may also be forgetful and have poor judgment. Have a responsible adult stay with you for the time you are told. It is important to have someone help care for you until you are awake and alert. Rest as told. Do not drive or operate machinery. Do not drink alcohol or take sleeping pills. Get help right away if you have trouble breathing, or if you suddenly become confused. This information is not intended to replace advice given to you by your health care provider. Make sure you discuss any questions you have with your health care provider. Document Revised: 03/07/2021 Document Reviewed: 03/05/2019 Elsevier Patient Education  [place].

## 2021-10-02 ENCOUNTER — Encounter (HOSPITAL_COMMUNITY): Payer: Self-pay

## 2021-10-02 ENCOUNTER — Other Ambulatory Visit (HOSPITAL_COMMUNITY)
Admission: RE | Admit: 2021-10-02 | Discharge: 2021-10-02 | Disposition: A | Discharge status: Home / Self Care | Payer: Medicare Other | Source: Ambulatory Visit | Attending: Gastroenterology | Admitting: Gastroenterology

## 2021-10-02 ENCOUNTER — Encounter (HOSPITAL_COMMUNITY)
Admission: RE | Admit: 2021-10-02 | Discharge: 2021-10-02 | Disposition: A | Discharge status: Home / Self Care | Payer: Medicare Other | Source: Ambulatory Visit | Attending: Gastroenterology | Admitting: Gastroenterology

## 2021-10-02 ENCOUNTER — Other Ambulatory Visit: Payer: Self-pay

## 2021-10-02 VITALS — BP 129/58 | HR 72 | Temp 97.5°F | Resp 18 | Ht 64.5 in | Wt 132.3 lb

## 2021-10-02 DIAGNOSIS — Z01818 Encounter for other preprocedural examination: Secondary | ICD-10-CM | POA: Insufficient documentation

## 2021-10-02 DIAGNOSIS — I10 Essential (primary) hypertension: Secondary | ICD-10-CM

## 2021-10-02 HISTORY — DX: Nausea with vomiting, unspecified: R11.2

## 2021-10-02 HISTORY — DX: Other specified postprocedural states: Z98.890

## 2021-10-02 LAB — BASIC METABOLIC PANEL
Anion gap: 5 (ref 5–15)
BUN: 8 mg/dL (ref 8–23)
CO2: 29 mmol/L (ref 22–32)
Calcium: 9.3 mg/dL (ref 8.9–10.3)
Chloride: 107 mmol/L (ref 98–111)
Creatinine, Ser: 0.71 mg/dL (ref 0.44–1.00)
GFR, Estimated: >60 mL/min (ref >60)
Glucose, Bld: 109 mg/dL — ABNORMAL HIGH (ref 70–99)
Potassium: 3.6 mmol/L (ref 3.5–5.1)
Sodium: 141 mmol/L (ref 135–145)

## 2021-10-04 ENCOUNTER — Ambulatory Visit (HOSPITAL_COMMUNITY): Payer: Medicare Other | Admitting: Anesthesiology

## 2021-10-04 ENCOUNTER — Encounter (HOSPITAL_COMMUNITY): Payer: Self-pay | Admitting: Gastroenterology

## 2021-10-04 ENCOUNTER — Encounter (HOSPITAL_COMMUNITY)
Admission: RE | Disposition: A | Discharge status: Home / Self Care | Payer: Self-pay | Source: Home / Self Care | Attending: Gastroenterology

## 2021-10-04 ENCOUNTER — Other Ambulatory Visit: Payer: Self-pay

## 2021-10-04 ENCOUNTER — Ambulatory Visit (HOSPITAL_BASED_OUTPATIENT_CLINIC_OR_DEPARTMENT_OTHER): Payer: Medicare Other | Admitting: Anesthesiology

## 2021-10-04 ENCOUNTER — Ambulatory Visit (HOSPITAL_COMMUNITY)
Admission: RE | Admit: 2021-10-04 | Discharge: 2021-10-04 | Disposition: A | Discharge status: Home / Self Care | Payer: Medicare Other | Attending: Gastroenterology | Admitting: Gastroenterology

## 2021-10-04 DIAGNOSIS — D120 Benign neoplasm of cecum: Secondary | ICD-10-CM | POA: Insufficient documentation

## 2021-10-04 DIAGNOSIS — R1310 Dysphagia, unspecified: Secondary | ICD-10-CM

## 2021-10-04 DIAGNOSIS — G709 Myoneural disorder, unspecified: Secondary | ICD-10-CM | POA: Diagnosis not present

## 2021-10-04 DIAGNOSIS — M1990 Unspecified osteoarthritis, unspecified site: Secondary | ICD-10-CM | POA: Insufficient documentation

## 2021-10-04 DIAGNOSIS — K589 Irritable bowel syndrome without diarrhea: Secondary | ICD-10-CM | POA: Insufficient documentation

## 2021-10-04 DIAGNOSIS — Z1211 Encounter for screening for malignant neoplasm of colon: Secondary | ICD-10-CM

## 2021-10-04 DIAGNOSIS — E7800 Pure hypercholesterolemia, unspecified: Secondary | ICD-10-CM

## 2021-10-04 DIAGNOSIS — K8050 Calculus of bile duct without cholangitis or cholecystitis without obstruction: Secondary | ICD-10-CM

## 2021-10-04 DIAGNOSIS — K449 Diaphragmatic hernia without obstruction or gangrene: Secondary | ICD-10-CM

## 2021-10-04 DIAGNOSIS — K644 Residual hemorrhoidal skin tags: Secondary | ICD-10-CM

## 2021-10-04 DIAGNOSIS — K219 Gastro-esophageal reflux disease without esophagitis: Secondary | ICD-10-CM | POA: Diagnosis not present

## 2021-10-04 DIAGNOSIS — E785 Hyperlipidemia, unspecified: Secondary | ICD-10-CM | POA: Insufficient documentation

## 2021-10-04 DIAGNOSIS — K5730 Diverticulosis of large intestine without perforation or abscess without bleeding: Secondary | ICD-10-CM

## 2021-10-04 DIAGNOSIS — K635 Polyp of colon: Secondary | ICD-10-CM

## 2021-10-04 DIAGNOSIS — K834 Spasm of sphincter of Oddi: Secondary | ICD-10-CM

## 2021-10-04 DIAGNOSIS — I10 Essential (primary) hypertension: Secondary | ICD-10-CM

## 2021-10-04 HISTORY — PX: POLYPECTOMY: SHX5525

## 2021-10-04 HISTORY — PX: COLONOSCOPY WITH PROPOFOL: SHX5780

## 2021-10-04 HISTORY — PX: ESOPHAGEAL DILATION: SHX303

## 2021-10-04 HISTORY — PX: ESOPHAGOGASTRODUODENOSCOPY (EGD) WITH PROPOFOL: SHX5813

## 2021-10-04 LAB — HM COLONOSCOPY

## 2021-10-04 SURGERY — COLONOSCOPY WITH PROPOFOL
Anesthesia: General

## 2021-10-04 MED ORDER — LIDOCAINE HCL (CARDIAC) PF 100 MG/5ML IV SOSY
PREFILLED_SYRINGE | INTRAVENOUS | Status: DC | PRN
  Administered 2021-10-04: 50 mg via INTRAVENOUS

## 2021-10-04 MED ORDER — SODIUM CHLORIDE 0.9 % IV SOLN: INTRAVENOUS | Status: DC

## 2021-10-04 MED ORDER — PROPOFOL 500 MG/50ML IV EMUL
INTRAVENOUS | Status: DC | PRN
  Administered 2021-10-04: 150 ug/kg/min via INTRAVENOUS

## 2021-10-04 MED ORDER — LACTATED RINGERS IV SOLN: INTRAVENOUS | Status: DC

## 2021-10-04 MED ORDER — PROPOFOL 10 MG/ML IV BOLUS
INTRAVENOUS | Status: DC | PRN
  Administered 2021-10-04 (×2): 50 mg via INTRAVENOUS
  Administered 2021-10-04: 100 mg via INTRAVENOUS
  Administered 2021-10-04: 50 mg via INTRAVENOUS

## 2021-10-04 NOTE — Anesthesia Procedure Notes (Signed)
Date/Time: 10/04/2021 7:44 AM  Performed by: Orlie Dakin, CRNAPre-anesthesia Checklist: Patient identified, Emergency Drugs available, Suction available and Patient being monitored Patient Re-evaluated:Patient Re-evaluated prior to induction Oxygen Delivery Method: Nasal cannula Induction Type: IV induction Placement Confirmation: positive ETCO2

## 2021-10-04 NOTE — H&P (Signed)
Ashley Carter is an 66 y.o. female.   Chief Complaint: Dysphagia, GERD and colorectal cancer screening HPI: Ashley Carter is a 66 y.o. 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  Last colonoscopy was in 2013.  No family history of colon cancer.  States that she has presented occasional dysphagia even with not swallowing.  Denies any heartburn or regurgitation recently.  Past Medical History:  Diagnosis Date   Arthritis    GERD (gastroesophageal reflux disease)    Hemorrhoid    High cholesterol    IBS (irritable bowel syndrome)    PONV (postoperative nausea and vomiting)     Past Surgical History:  Procedure Laterality Date   BIOPSY  03/23/2016   Procedure: BIOPSY;  Surgeon: Rogene Houston, MD;  Location: [facility];  Service: Endoscopy;;  gastric bx   CHOLECYSTECTOMY     1993   COLONOSCOPY  05/31/2011   Procedure: COLONOSCOPY;  Surgeon: Rogene Houston, MD;  Location: [facility];  Service: Endoscopy;  Laterality: N/A;  1200   ESOPHAGEAL DILATION N/A 03/23/2016   Procedure: ESOPHAGEAL DILATION;  Surgeon: Rogene Houston, MD;  Location: [facility];  Service: Endoscopy;  Laterality: N/A;   ESOPHAGOGASTRODUODENOSCOPY N/A 03/23/2016   Procedure: ESOPHAGOGASTRODUODENOSCOPY (EGD);  Surgeon: Rogene Houston, MD;  Location: [facility];  Service: Endoscopy;  Laterality: N/A;  1:50   RECTO-VAGINAL FISSURE REPAIR  x 2   after child birth   spincter of oddi dysfunction     repair x2    Family History  Problem Relation Age of Onset   Anesthesia problems Neg Hx    Hypotension Neg Hx    Malignant hyperthermia Neg Hx    Pseudochol deficiency Neg Hx    Colon cancer Neg Hx    Social History:  reports that she has never  smoked. She has never used smokeless tobacco. She reports that she does not drink alcohol and does not use drugs.  Allergies:  Allergies  Allergen Reactions   Kiwi Extract Anaphylaxis and Swelling    Swelling of the throat   Other Anaphylaxis and Swelling    English walnuts   Morphine And Related Nausea And Vomiting    Dry heaves    Vibramycin [Doxycycline Calcium] Other (See Comments)    Stomach Ulcers     Medications Prior to Admission  Medication Sig Dispense Refill   Calcium Carbonate-Vit D-Min (CALCIUM 1200) 1200-1000 MG-UNIT CHEW Chew 1 tablet by mouth 2 (two) times daily. Patient reports that this is soft capsules.     cyanocobalamin (,VITAMIN B-12,) 1000 MCG/ML injection Inject 1,000 mcg into the muscle every 30 (thirty) days.     esomeprazole (NEXIUM) 40 MG capsule TAKE 1 CAPSULE BY MOUTH EVERY DAY IN THE MORNING 90 capsule 3   Flaxseed, Linseed, 1000 MG CAPS Take 1,000 mg by mouth in the morning, at noon, and at bedtime.     GLUCOSAMINE-CALCIUM-VIT D PO Take 1 tablet by mouth in the morning and at bedtime.     hydrochlorothiazide (MICROZIDE) 12.5 MG capsule Take 12.5 mg by mouth daily as needed (ankle swelling).     Lactobacillus-Inulin (CULTURELLE DIGESTIVE DAILY PO) Take 1 Capful by mouth daily.     loratadine (CLARITIN) 10 MG tablet Take 10 mg by mouth daily as  needed for allergies.     Multiple Vitamin (MULTIVITAMIN) capsule Take 1 capsule by mouth daily.     naproxen sodium (ALEVE) 220 MG tablet Take 220 mg by mouth daily as needed (pain).     OVER THE COUNTER MEDICATION Take 1 Capful by mouth daily at 6 (six) AM. Jonette Eva UTI prevention     polyethylene glycol-electrolytes (TRILYTE) 420 g solution Take 4,000 mLs by mouth as directed. 4000 mL 0   ursodiol (ACTIGALL) 500 MG tablet Take 1 tablet (500 mg total) by mouth 2 (two) times daily. (Patient taking differently: Take 500 mg by mouth daily.) 180 tablet 3    Results for orders placed or performed during the hospital  encounter of 10/02/21 (from the past 48 hour(s))  Basic metabolic panel     Status: Abnormal   Collection Time: 10/02/21  9:00 AM  Result Value Ref Range   Sodium 141 135 - 145 mmol/L   Potassium 3.6 3.5 - 5.1 mmol/L   Chloride 107 98 - 111 mmol/L   CO2 29 22 - 32 mmol/L   Glucose, Bld 109 (H) 70 - 99 mg/dL    Comment: Glucose reference range applies only to samples taken after fasting for at least 8 hours.   BUN 8 8 - 23 mg/dL   Creatinine, Ser 0.71 0.44 - 1.00 mg/dL   Calcium 9.3 8.9 - 10.3 mg/dL   GFR, Estimated >60 >60 mL/min    Comment: (NOTE) Calculated using the CKD-EPI Creatinine Equation (2021)    Anion gap 5 5 - 15    Comment: Performed at [facility], [address] [place], [place] [address]   No results found.  Review of Systems  Constitutional: Negative.   HENT: Negative.    Eyes: Negative.   Respiratory: Negative.    Cardiovascular: Negative.   Gastrointestinal: Negative.   Endocrine: Negative.   Genitourinary: Negative.   Musculoskeletal: Negative.   Skin: Negative.   Allergic/Immunologic: Negative.   Neurological: Negative.   Hematological: Negative.     Blood pressure (!) 143/71, pulse 88, temperature 98 F (36.7 C), temperature source Oral, resp. rate 16, SpO2 97 %. Physical Exam  GENERAL: The patient is AO x3, in no acute distress. HEENT: Head is normocephalic and atraumatic. EOMI are intact. Mouth is well hydrated and without lesions. NECK: Supple. No masses LUNGS: Clear to auscultation. No presence of rhonchi/wheezing/rales. Adequate chest expansion HEART: RRR, normal s1 and s2. ABDOMEN: Soft, nontender, no guarding, no peritoneal signs, and nondistended. BS +. No masses. EXTREMITIES: Without any cyanosis, clubbing, rash, lesions or edema. NEUROLOGIC: AOx3, no focal motor deficit. SKIN: no jaundice, no rashes'  Assessment/Plan [name] is a 66 y.o. 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  We will proceed with EGD and colonoscopy.  Harvel Quale, MD 10/04/2021, 7:37 AM

## 2021-10-04 NOTE — Op Note (Signed)
[facility] Patient Name: Ashley Carter Procedure Date: 10/04/2021 7:17 AM MRN: [id number] Date of Birth: 06-06-1955 Attending MD: [name] ,  CSN: [id number] Age: 66 Admit Type: Outpatient Procedure:                Upper GI endoscopy Indications:              Dysphagia, Gastro-esophageal reflux disease Providers:                [name], Janeece Riggers, RN, Raphael Gibney,                            Technician Referring MD:              Medicines:                Monitored Anesthesia Care Complications:            No immediate complications. Estimated Blood Loss:     Estimated blood loss: none. Procedure:                Pre-Anesthesia Assessment:                           - Prior to the procedure, a History and Physical                            was performed, and patient medications, allergies                            and sensitivities were reviewed. The patient's                            tolerance of previous anesthesia was reviewed.                           - The risks and benefits of the procedure and the                            sedation options and risks were discussed with the                            patient. All questions were answered and informed                            consent was obtained.                           - ASA Grade Assessment: II - A patient with mild                            systemic disease.                           After obtaining informed consent, the endoscope was                            passed under direct vision. Throughout the  procedure, the patient's blood pressure, pulse, and                            oxygen saturations were monitored continuously. The                            [id number]-[id number] ([id number]) scope was introduced through the                            mouth, and advanced to the second part of duodenum.                            The upper GI endoscopy was accomplished without                             difficulty. The patient tolerated the procedure                            well. Scope In: 7:44:12 AM Scope Out: 7:50:47 AM Total Procedure Duration: 0 hours 6 minutes 35 seconds  Findings:      No endoscopic abnormality was evident in the esophagus to explain the       patient's complaint of dysphagia. It was decided, however, to proceed       with dilation of the entire esophagus. A guidewire was placed and the       scope was withdrawn. Dilation was performed with a Savary dilator with       mild resistance at 18 mm. No mucosal disruption was seen upon       reinspection.      A 2 cm hiatal hernia was present.      The gastroesophageal flap valve was visualized endoscopically and       classified as Hill Grade II (fold present, opens with respiration).      The stomach was normal.      The examined duodenum was normal. Impression:               - No endoscopic esophageal abnormality to explain                            patient's dysphagia. Esophagus dilated. Dilated.                           - 2 cm hiatal hernia.                           - Gastroesophageal flap valve classified as Hill                            Grade II (fold present, opens with respiration).                           - Normal stomach.                           - Normal examined duodenum.                           -  No specimens collected. Moderate Sedation:      Per Anesthesia Care Recommendation:           - Discharge patient to home (ambulatory).                           - Resume previous diet.                           - Continue present medications.                           - If recurrent episodes of heartburn or interested                            in not taking antacids, can discuss possiblity of                            Transoral Incisionless Fundoplication (TIF).                           - If recurrent dysphagia, may proceed with MBS. Procedure Code(s):        ---  Professional ---                           [phone], Esophagogastroduodenoscopy, flexible,                            transoral; with insertion of guide wire followed by                            passage of dilator(s) through esophagus over guide                            wire Diagnosis Code(s):        --- Professional ---                           R13.10, Dysphagia, unspecified                           K44.9, Diaphragmatic hernia without obstruction or                            gangrene                           K21.9, Gastro-esophageal reflux disease without                            esophagitis CPT copyright 2019 [employer]. All rights reserved. The codes documented in this report are preliminary and upon coder review may  be revised to meet current compliance requirements. [name], MD [name],  10/04/2021 7:55:38 AM This report has been signed electronically. Number of Addenda: 0

## 2021-10-04 NOTE — Op Note (Signed)
[facility] Patient Name: Ashley Carter Procedure Date: 10/04/2021 7:16 AM MRN: [id number] Date of Birth: [birth year] Attending MD: [name] ,  CSN: [id number] Age: 66 Admit Type: Outpatient Procedure:                Colonoscopy Indications:              Screening for colorectal malignant neoplasm Providers:                [name], Janeece Riggers, RN, Raphael Gibney,                            Technician Referring MD:              Medicines:                Monitored Anesthesia Care Complications:            No immediate complications. Estimated Blood Loss:     Estimated blood loss: none. Procedure:                Pre-Anesthesia Assessment:                           - Prior to the procedure, a History and Physical                            was performed, and patient medications, allergies                            and sensitivities were reviewed. The patient's                            tolerance of previous anesthesia was reviewed.                           - The risks and benefits of the procedure and the                            sedation options and risks were discussed with the                            patient. All questions were answered and informed                            consent was obtained.                           - ASA Grade Assessment: II - A patient with mild                            systemic disease.                           After obtaining informed consent, the colonoscope                            was passed under direct vision. Throughout the  procedure, the patient's blood pressure, pulse, and                            oxygen saturations were monitored continuously. The                            [id number]-HQ190L ([id number]) scope was introduced through                            the anus and advanced to the the cecum, identified                            by appendiceal orifice and ileocecal valve. The                             colonoscopy was performed without difficulty. The                            patient tolerated the procedure well. The quality                            of the bowel preparation was excellent. Scope In: 7:56:39 AM Scope Out: 8:15:36 AM Scope Withdrawal Time: 0 hours 14 minutes 19 seconds  Total Procedure Duration: 0 hours 18 minutes 57 seconds  Findings:      Skin tags were found on perianal exam.      A 1 mm polyp was found in the cecum. The polyp was sessile. The polyp       was removed with a cold biopsy forceps. Resection and retrieval were       complete.      A single large-mouthed diverticulum was found in the transverse colon.      The retroflexed view of the distal rectum and anal verge was normal and       showed no anal or rectal abnormalities. Impression:               - Perianal skin tags found on perianal exam.                           - One 1 mm polyp in the cecum, removed with a cold                            biopsy forceps. Resected and retrieved.                           - Diverticulosis in the transverse colon.                           - The distal rectum and anal verge are normal on                            retroflexion view. Moderate Sedation:      Per Anesthesia Care Recommendation:           - Discharge patient to home (ambulatory).                           -  Resume previous diet.                           - Await pathology results.                           - Repeat colonoscopy for surveillance based on                            pathology results. Procedure Code(s):        --- Professional ---                           [phone], Colonoscopy, flexible; with biopsy, single                            or multiple Diagnosis Code(s):        --- Professional ---                           Z12.11, Encounter for screening for malignant                            neoplasm of colon                           K63.5, Polyp of colon                            K64.4, Residual hemorrhoidal skin tags                           K57.30, Diverticulosis of large intestine without                            perforation or abscess without bleeding CPT copyright 2019 American Medical Association. All rights reserved. The codes documented in this report are preliminary and upon coder review may  be revised to meet current compliance requirements. [name], MD [name],  10/04/2021 8:19:47 AM This report has been signed electronically. Number of Addenda: 0

## 2021-10-04 NOTE — Anesthesia Postprocedure Evaluation (Signed)
Anesthesia Post Note  Patient: Ashley Carter  Procedure(s) Performed: COLONOSCOPY WITH PROPOFOL ESOPHAGOGASTRODUODENOSCOPY (EGD) WITH PROPOFOL ESOPHAGEAL DILATION POLYPECTOMY  Patient location during evaluation: Phase II Anesthesia Type: General Level of consciousness: awake and alert and oriented Pain management: pain level controlled Vital Signs Assessment: post-procedure vital signs reviewed and stable Respiratory status: spontaneous breathing, nonlabored ventilation and respiratory function stable Cardiovascular status: blood pressure returned to baseline and stable Postop Assessment: no apparent nausea or vomiting Anesthetic complications: no   No notable events documented.   Last Vitals:  Vitals:   10/04/21 0647 10/04/21 0820  BP: (!) 143/71 112/60  Pulse: 88 85  Resp: 16 15  Temp: 36.7 C (!) 36.3 C  SpO2: 97% 100%    Last Pain:  Vitals:   10/04/21 0820  TempSrc: Axillary  PainSc: 0-No pain                 Sangeeta Youse C Terina Mcelhinny

## 2021-10-04 NOTE — Discharge Instructions (Addendum)
You are being discharged to home.  Resume your previous diet.  Continue your present medications.  If recurrent episodes of heartburn or interested in not taking antacids, can discuss possiblity of Transoral Incisionless Fundoplication (TIF). If recurrent dysphagia, may proceed with MBS. We are waiting for your pathology results.  Your physician has recommended a repeat colonoscopy for surveillance based on pathology results.

## 2021-10-04 NOTE — Anesthesia Preprocedure Evaluation (Signed)
Anesthesia Evaluation  Patient identified by MRN, date of birth, ID band Patient awake    Reviewed: Allergy & Precautions, NPO status , Patient's Chart, lab work & pertinent test results  History of Anesthesia Complications (+) PONV and history of anesthetic complications  Airway Mallampati: II  TM Distance: >3 FB Neck ROM: Full    Dental  (+) Dental Advisory Given Crowns :   Pulmonary neg pulmonary ROS,    Pulmonary exam normal breath sounds clear to auscultation       Cardiovascular negative cardio ROS Normal cardiovascular exam Rhythm:Regular Rate:Normal     Neuro/Psych  Neuromuscular disease negative psych ROS   GI/Hepatic Neg liver ROS, GERD (severe)  Medicated and Controlled,  Endo/Other  negative endocrine ROS  Renal/GU negative Renal ROS  negative genitourinary   Musculoskeletal  (+) Arthritis , Osteoarthritis,    Abdominal   Peds negative pediatric ROS (+)  Hematology negative hematology ROS (+)   Anesthesia Other Findings   Reproductive/Obstetrics negative OB ROS                            Anesthesia Physical Anesthesia Plan  ASA: 2  Anesthesia Plan: General   Post-op Pain Management: Minimal or no pain anticipated   Induction: Intravenous  PONV Risk Score and Plan: Propofol infusion  Airway Management Planned: Nasal Cannula and Natural Airway  Additional Equipment:   Intra-op Plan:   Post-operative Plan:   Informed Consent: I have reviewed the patients History and Physical, chart, labs and discussed the procedure including the risks, benefits and alternatives for the proposed anesthesia with the patient or authorized representative who has indicated his/her understanding and acceptance.     Dental advisory given  Plan Discussed with: Surgeon and CRNA  Anesthesia Plan Comments:         Anesthesia Quick Evaluation

## 2021-10-04 NOTE — Transfer of Care (Signed)
Immediate Anesthesia Transfer of Care Note  Patient: Ashley Carter  Procedure(s) Performed: COLONOSCOPY WITH PROPOFOL ESOPHAGOGASTRODUODENOSCOPY (EGD) WITH PROPOFOL ESOPHAGEAL DILATION POLYPECTOMY  Patient Location: Short Stay  Anesthesia Type:General  Level of Consciousness: awake  Airway & Oxygen Therapy: Patient Spontanous Breathing  Post-op Assessment: Report given to RN and Post -op Vital signs reviewed and stable  Post vital signs: Reviewed and stable  Last Vitals:  Vitals Value Taken Time  BP    Temp    Pulse    Resp    SpO2      Last Pain:  Vitals:   10/04/21 0647  TempSrc: Oral  PainSc: 0-No pain         Complications: No notable events documented.

## 2021-10-05 ENCOUNTER — Encounter (INDEPENDENT_AMBULATORY_CARE_PROVIDER_SITE_OTHER): Payer: Self-pay | Admitting: *Deleted

## 2021-10-05 LAB — SURGICAL PATHOLOGY

## 2021-10-10 ENCOUNTER — Encounter (HOSPITAL_COMMUNITY): Payer: Self-pay | Admitting: Gastroenterology

## 2021-11-15 ENCOUNTER — Other Ambulatory Visit: Payer: Self-pay

## 2021-11-15 DIAGNOSIS — R1310 Dysphagia, unspecified: Secondary | ICD-10-CM

## 2021-11-15 DIAGNOSIS — I10 Essential (primary) hypertension: Secondary | ICD-10-CM

## 2021-11-15 DIAGNOSIS — K8050 Calculus of bile duct without cholangitis or cholecystitis without obstruction: Secondary | ICD-10-CM

## 2021-11-15 DIAGNOSIS — K219 Gastro-esophageal reflux disease without esophagitis: Secondary | ICD-10-CM

## 2021-11-15 MED ORDER — URSODIOL 500 MG PO TABS: 500.0000 mg | ORAL_TABLET | Freq: Two times a day (BID) | ORAL | 3 refills | Status: DC

## 2021-11-20 ENCOUNTER — Other Ambulatory Visit (INDEPENDENT_AMBULATORY_CARE_PROVIDER_SITE_OTHER): Payer: Self-pay | Admitting: Gastroenterology

## 2021-11-20 ENCOUNTER — Encounter (INDEPENDENT_AMBULATORY_CARE_PROVIDER_SITE_OTHER): Payer: Self-pay | Admitting: Gastroenterology

## 2021-11-20 DIAGNOSIS — K8050 Calculus of bile duct without cholangitis or cholecystitis without obstruction: Secondary | ICD-10-CM

## 2021-11-20 DIAGNOSIS — R1310 Dysphagia, unspecified: Secondary | ICD-10-CM

## 2021-11-20 DIAGNOSIS — I10 Essential (primary) hypertension: Secondary | ICD-10-CM

## 2021-11-20 DIAGNOSIS — K219 Gastro-esophageal reflux disease without esophagitis: Secondary | ICD-10-CM

## 2021-11-20 MED ORDER — URSODIOL 500 MG PO TABS: 500.0000 mg | ORAL_TABLET | Freq: Two times a day (BID) | ORAL | 3 refills | Status: DC

## 2022-01-29 ENCOUNTER — Encounter (INDEPENDENT_AMBULATORY_CARE_PROVIDER_SITE_OTHER): Payer: Self-pay | Admitting: Gastroenterology

## 2022-01-29 ENCOUNTER — Ambulatory Visit (INDEPENDENT_AMBULATORY_CARE_PROVIDER_SITE_OTHER): Payer: Medicare Other | Admitting: Gastroenterology

## 2022-01-29 VITALS — BP 151/82 | HR 87 | Temp 97.4°F | Ht 64.0 in | Wt 133.7 lb

## 2022-01-29 DIAGNOSIS — T17308D Unspecified foreign body in larynx causing other injury, subsequent encounter: Secondary | ICD-10-CM

## 2022-01-29 DIAGNOSIS — K219 Gastro-esophageal reflux disease without esophagitis: Secondary | ICD-10-CM

## 2022-01-29 DIAGNOSIS — T17308A Unspecified foreign body in larynx causing other injury, initial encounter: Secondary | ICD-10-CM | POA: Insufficient documentation

## 2022-01-29 MED ORDER — ESOMEPRAZOLE MAGNESIUM 20 MG PO CPDR: 20.0000 mg | DELAYED_RELEASE_CAPSULE | Freq: Every day | ORAL | 3 refills | Status: DC

## 2022-01-29 NOTE — Patient Instructions (Signed)
Schedule modified barium swallow Decrease Nexium to 20 mg qday

## 2022-01-29 NOTE — Progress Notes (Unsigned)
[name], M.D. Gastroenterology & Hepatology [facility] [address] [place], [place] [address]  Primary Care Physician: Tillman Abide, FNP [place] [id number]  I will communicate my assessment and recommendations to the referring MD via EMR.  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  The patient was last seen on 07/31/2021. At that time, the patient was scheduled for an EGD with finding described below.  Patient reports that after she had her dilation, she felt her swallowing improved for a month but then her symptoms. She states that she is having episodes of choking and coughing when swallowing food and liquids. She thinks this is happening more frequently with liquids than with solids. Usually solids can lead to this if they are spicy. Also states that she has a constant discomfort in her throat and has to clear her throat frequently. She has frequent nasal discharge.  The patient denies having any nausea, vomiting, fever, chills, hematochezia, melena, hematemesis, abdominal distention, abdominal pain, diarrhea, jaundice, pruritus or weight loss.  She is on Nexium 40 mg qday, which controls sour taste in her mouth.  Last EGD: 10/04/2021 - No endoscopic esophageal abnormality to explain patient's dysphagia. Esophagus dilated. - 2 cm hiatal hernia. - Gastroesophageal flap valve classified as Hill Grade II (fold present, opens with respiration). - Normal stomach. -  Normal examined duodenum. - No specimens collected.  Last Colonoscopy: 10/04/2021 - Perianal skin tags found on perianal exam. - One 1 mm polyp in the cecum, removed with a cold biopsy forceps. - Diverticulosis in the transverse colon.  Path: A. COLON, CECAL, POLYPECTOMY:  Findings consistent with tubular adenoma.  No high-grade dysplasia or malignancy is seen.   Repeat in 7 yearts  Past Medical History: Past Medical History:  Diagnosis Date   Arthritis    GERD (gastroesophageal reflux disease)    Hemorrhoid    High cholesterol    IBS (irritable bowel syndrome)    PONV (postoperative nausea and vomiting)     Past Surgical History: Past Surgical History:  Procedure Laterality Date   BIOPSY  03/23/2016   Procedure: BIOPSY;  Surgeon: Rogene Houston, MD;  [facility];  Service: Endoscopy;;  gastric bx   CHOLECYSTECTOMY     1993   COLONOSCOPY  05/31/2011   Procedure: COLONOSCOPY;  Surgeon: Rogene Houston, MD;  [facility];  Service: Endoscopy;  Laterality: N/A;  1200   COLONOSCOPY WITH PROPOFOL N/A 10/04/2021   Procedure: COLONOSCOPY WITH PROPOFOL;  Surgeon: Harvel Quale, MD;  [facility]: [facility];  Service: Gastroenterology;  Laterality: N/A;  730 ASA 2   ESOPHAGEAL DILATION N/A 03/23/2016   Procedure: ESOPHAGEAL DILATION;  Surgeon: Rogene Houston, MD;  [facility];  Service: Endoscopy;  Laterality: N/A;   ESOPHAGEAL DILATION N/A 10/04/2021   Procedure: ESOPHAGEAL DILATION;  Surgeon: Harvel Quale, MD;  [facility];  Service: Gastroenterology;  Laterality: N/A;   ESOPHAGOGASTRODUODENOSCOPY N/A 03/23/2016   Procedure: ESOPHAGOGASTRODUODENOSCOPY (EGD);  Surgeon: Rogene Houston, MD;  [facility];  Service: Endoscopy;  Laterality: N/A;  1:50   ESOPHAGOGASTRODUODENOSCOPY (EGD) WITH PROPOFOL N/A 10/04/2021   Procedure: ESOPHAGOGASTRODUODENOSCOPY (EGD) WITH PROPOFOL;  Surgeon: Harvel Quale, MD;  [facility];  Service: Gastroenterology;  Laterality: N/A;   POLYPECTOMY  10/04/2021   Procedure: POLYPECTOMY;  Surgeon: Harvel Quale, MD;  [facility];  Service: Gastroenterology;;   [place]  x 2   after child birth   spincter of oddi dysfunction     repair x2    Family History: Family History  Problem Relation Age of Onset   Anesthesia problems Neg Hx    Hypotension Neg Hx    Malignant hyperthermia Neg Hx    Pseudochol deficiency Neg Hx    Colon cancer Neg Hx     Social History: Social History   Tobacco Use  Smoking Status Never  Smokeless Tobacco Never   Social History   Substance and Sexual Activity  Alcohol Use No   Social History   Substance and Sexual Activity  Drug Use No    Allergies: Allergies  Allergen Reactions   Kiwi Extract Anaphylaxis and Swelling    Swelling of the throat   Other Anaphylaxis and Swelling    English walnuts   Morphine And Related Nausea And Vomiting    Dry heaves    Vibramycin [Doxycycline Calcium] Other (See Comments)    Stomach Ulcers     Medications: Current Outpatient Medications  Medication Sig Dispense Refill   Calcium Carbonate-Vit D-Min (CALCIUM 1200) 1200-1000 MG-UNIT CHEW Chew 1 tablet by mouth 2 (two) times daily. Patient reports that this is soft capsules.     cyanocobalamin (,VITAMIN B-12,) 1000 MCG/ML injection Inject 1,000 mcg into the muscle every 30 (thirty) days.     esomeprazole (NEXIUM) 40 MG capsule TAKE 1 CAPSULE BY MOUTH EVERY DAY IN THE MORNING 90 capsule 3   Flaxseed, Linseed, 1000 MG CAPS Take 1,000 mg by mouth in the morning, at noon, and at bedtime.     GLUCOSAMINE-CALCIUM-VIT D PO Take 1 tablet by mouth in the morning and at bedtime.     hydrochlorothiazide (MICROZIDE) 12.5 MG capsule Take 12.5 mg by mouth daily as needed (ankle swelling).     Lactobacillus-Inulin (CULTURELLE DIGESTIVE DAILY PO) Take 1 Capful by mouth daily.      loratadine (CLARITIN) 10 MG tablet Take 10 mg by mouth daily as needed for allergies.     naproxen sodium (ALEVE) 220 MG tablet Take 220 mg by mouth daily as needed (pain).     OVER THE COUNTER MEDICATION Take 1 Capful by mouth daily at 6 (six) AM. Jonette Eva UTI prevention     OVER THE COUNTER MEDICATION Nutrafol Qid     ursodiol (ACTIGALL) 500 MG tablet Take 1 tablet (500 mg total) by mouth 2 (two) times daily. 180 tablet 3   No current facility-administered medications for this visit.    Review of Systems: GENERAL: negative for malaise, night sweats HEENT: No changes in hearing or vision, no nose bleeds or other nasal problems. NECK: Negative for lumps, goiter, pain and significant neck swelling RESPIRATORY: Negative for cough, wheezing CARDIOVASCULAR: Negative for chest pain, leg swelling, palpitations, orthopnea GI: SEE HPI MUSCULOSKELETAL: Negative for joint pain or swelling, back pain, and muscle pain. SKIN: Negative for lesions, rash PSYCH: Negative for sleep disturbance, mood disorder and recent psychosocial stressors. HEMATOLOGY Negative for prolonged bleeding, bruising easily, and swollen nodes. ENDOCRINE: Negative for cold or heat intolerance, polyuria, polydipsia and goiter. NEURO: negative for  tremor, gait imbalance, syncope and seizures. The remainder of the review of systems is noncontributory.   Physical Exam: BP (!) 151/82 (BP Location: Left Arm, Patient Position: Sitting, Cuff Size: Large)   Pulse 87   Temp (!) 97.4 F (36.3 C) (Oral)   Ht '5\' 4"'$  (1.626 m)   Wt 133 lb 11.2 oz (60.6 kg)   BMI 22.95 kg/m  GENERAL: The patient is AO x3, in no acute distress. HEENT: Head is normocephalic and atraumatic. EOMI are intact. Mouth is well hydrated and without lesions. NECK: Supple. No masses LUNGS: Clear to auscultation. No presence of rhonchi/wheezing/rales. Adequate chest expansion HEART: RRR, normal s1 and s2. ABDOMEN: Soft, nontender, no guarding, no peritoneal  signs, and nondistended. BS +. No masses. RECTAL EXAM: no external lesions, normal tone, no masses, brown stool without blood.*** Chaperone: EXTREMITIES: Without any cyanosis, clubbing, rash, lesions or edema. NEUROLOGIC: AOx3, no focal motor deficit. SKIN: no jaundice, no rashes  Imaging/Labs: as above  I personally reviewed and interpreted the available labs, imaging and endoscopic files.  Impression and Plan: Ashley Carter is a 66 y.o. female coming for follow up of ***   All questions were answered.      [name], MD Gastroenterology and Hepatology [facility]

## 2022-02-13 ENCOUNTER — Other Ambulatory Visit (HOSPITAL_COMMUNITY): Payer: Self-pay | Admitting: Specialist

## 2022-02-13 DIAGNOSIS — K219 Gastro-esophageal reflux disease without esophagitis: Secondary | ICD-10-CM

## 2022-02-13 DIAGNOSIS — R1312 Dysphagia, oropharyngeal phase: Secondary | ICD-10-CM

## 2022-02-26 ENCOUNTER — Encounter (HOSPITAL_COMMUNITY): Payer: Self-pay | Admitting: Speech Pathology

## 2022-02-26 ENCOUNTER — Ambulatory Visit (HOSPITAL_COMMUNITY): Payer: Medicare Other | Attending: Gastroenterology | Admitting: Speech Pathology

## 2022-02-26 ENCOUNTER — Ambulatory Visit (HOSPITAL_COMMUNITY)
Admission: RE | Admit: 2022-02-26 | Discharge: 2022-02-26 | Disposition: A | Discharge status: Home / Self Care | Payer: Medicare Other | Source: Ambulatory Visit | Attending: Gastroenterology | Admitting: Gastroenterology

## 2022-02-26 DIAGNOSIS — K219 Gastro-esophageal reflux disease without esophagitis: Secondary | ICD-10-CM | POA: Insufficient documentation

## 2022-02-26 DIAGNOSIS — R1312 Dysphagia, oropharyngeal phase: Secondary | ICD-10-CM | POA: Insufficient documentation

## 2022-02-27 NOTE — Therapy (Signed)
[place], [place], [address] Phone: [phone]   Fax:  [phone]  Modified Barium Swallow  Patient Details  Name: Ashley Carter MRN: [id number] Date of Birth: 06-04-1955 No data recorded  Encounter Date: 02/26/2022   End of Session - 02/27/22 1439     Visit Number 1    Number of Visits 1    Authorization Type Medicare    SLP Start Time 1135    SLP Stop Time  1201    SLP Time Calculation (min) 26 min    Activity Tolerance Patient tolerated treatment well             Past Medical History:  Diagnosis Date   Arthritis    GERD (gastroesophageal reflux disease)    Hemorrhoid    High cholesterol    IBS (irritable bowel syndrome)    PONV (postoperative nausea and vomiting)     Past Surgical History:  Procedure Laterality Date   BIOPSY  03/23/2016   Procedure: BIOPSY;  Surgeon: Rogene Houston, MD;  [facility];  Service: Endoscopy;;  gastric bx   CHOLECYSTECTOMY     1993   COLONOSCOPY  05/31/2011   Procedure: COLONOSCOPY;  Surgeon: Rogene Houston, MD;  [facility];  Service: Endoscopy;  Laterality: N/A;  1200   COLONOSCOPY WITH PROPOFOL N/A 10/04/2021   Procedure: COLONOSCOPY WITH PROPOFOL;  Surgeon: Harvel Quale, MD;  [facility];  Service: Gastroenterology;  Laterality: N/A;  730 ASA 2   ESOPHAGEAL DILATION N/A 03/23/2016   Procedure: ESOPHAGEAL DILATION;  Surgeon: Rogene Houston, MD;  [facility];  Service: Endoscopy;  Laterality: N/A;   ESOPHAGEAL DILATION N/A 10/04/2021   Procedure: ESOPHAGEAL DILATION;  Surgeon: Harvel Quale, MD;  [facility];  Service: Gastroenterology;  Laterality: N/A;   ESOPHAGOGASTRODUODENOSCOPY N/A 03/23/2016   Procedure: ESOPHAGOGASTRODUODENOSCOPY (EGD);  Surgeon: Rogene Houston, MD;  [facility];  Service: Endoscopy;  Laterality: N/A;  1:50   ESOPHAGOGASTRODUODENOSCOPY  (EGD) WITH PROPOFOL N/A 10/04/2021   Procedure: ESOPHAGOGASTRODUODENOSCOPY (EGD) WITH PROPOFOL;  Surgeon: Harvel Quale, MD;  [facility];  Service: Gastroenterology;  Laterality: N/A;   POLYPECTOMY  10/04/2021   Procedure: POLYPECTOMY;  Surgeon: Montez Morita, Quillian Quince, MD;  [facility];  Service: Gastroenterology;;   [place]  x 2   after child birth   spincter of oddi dysfunction     repair x2    There were no vitals filed for this visit.    General - 02/26/22 1427       General Information   Date of Onset 01/29/22    HPI Ashley Carter is a 66 yo female who was referred for MBSS by Dr. Jenetta Downer due to ongoing reports of difficulty swallowing, primarily with liquids. 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 Patient reports that after she had her dilation, she felt her swallowing improved for a month but then her symptoms recurred. She states that she is having episodes of choking and coughing when swallowing food and liquids. She thinks this is happening more frequently with liquids than with solids. Usually solids can lead to this if they are spicy, but there is if the food is better tolerated. Also states that she has a constant discomfort in  her throat and has to clear her throat frequently. She has frequent nasal discharge. Last EGD: 10/04/2021 - No endoscopic esophageal abnormality to explain patient's dysphagia. Esophagus dilated. - 2 cm hiatal hernia. - Gastroesophageal flap valve classified as Hill Grade II (fold present, opens with respiration). Pt indicates that her GERD symptoms have been well controlled on Nexium 40 mg every day, but dosage just decreased to 20 mg daily.   Type of Study MBS-Modified Barium Swallow Study    Previous Swallow Assessment  MBSS 2017 with recommendation for regular and thin    Diet Prior to this Study Regular;Thin liquids    Temperature Spikes Noted No    Respiratory Status Room air    History of Recent Intubation No    Behavior/Cognition Alert;Cooperative;Pleasant mood    Oral Cavity Assessment Within Functional Limits    Oral Care Completed by SLP No    Oral Cavity - Dentition Adequate natural dentition    Vision Functional for self feeding    Self-Feeding Abilities Able to feed self    Patient Positioning Upright in chair    Baseline Vocal Quality Normal    Volitional Cough Strong    Volitional Swallow Able to elicit    Anatomy Within functional limits    Pharyngeal Secretions Not observed secondary MBS                Oral Preparation/Oral Phase - 02/27/22 1430       Oral Preparation/Oral Phase   Oral Phase Within functional limits      Electrical stimulation - Oral Phase   Was Electrical Stimulation Used No              Pharyngeal Phase - 02/27/22 1430       Pharyngeal Phase   Pharyngeal Phase Impaired      Pharyngeal - Thin   Pharyngeal- Thin Teaspoon Swallow initiation at vallecula;Penetration/Aspiration during swallow;Reduced epiglottic inversion    Pharyngeal Material enters airway, remains ABOVE vocal cords then ejected out;Material does not enter airway    Pharyngeal- Thin Cup Reduced epiglottic inversion;Within functional limits    Pharyngeal- Thin Straw Within functional limits      Pharyngeal - Solids   Pharyngeal- Puree Swallow initiation at vallecula;Within functional limits    Pharyngeal- Regular Within functional limits    Pharyngeal- Pill Within functional limits      Electrical Stimulation - Pharyngeal Phase   Was Electrical Stimulation Used No              Cricopharyngeal Phase - 02/27/22 1436       Cervical Esophageal Phase   Cervical Esophageal Phase Within functional limits      Cervical Esophageal Phase - Solids   Pill Other (Comment)       Cervical Esophageal Phase - Comment   Other Esophageal Phase Observations Brief stasis of barium tablet in thoracic esophagus, clears with extra liquid wash            MBSS from 2017 <<Pt seen for MBSS while sitting in the lateral position in Ha[place] chair and presented with barium tinged thin water, puree, graham cracker, and barium tablet. Oropharyngeal swallow was [facility] with textures and consistencies presented. Pt with mild premature spillage with thin liquids (spilled to pyriforms with sequential straw sips which can be WNL) and piecemeal deglutition (double swallows to clear mild oral residuals after primary swallow). No penetration/aspiration or pharyngeal stasis observed throughout the duration of the study. Esophageal sweep was remarkable for delayed emptying in distal  esophagus with slight narrowing near GE junction (pill only transiently delayed). Sequential straw sips resulted in retrograde movement from distal esophagus to lower cervical esophagus. Please see images on PACS for view. Perhaps pt's esophageal phase is negatively impacting sensation in oropharyngeal stages. Pt reports increased effort with swallowing and that "oral cavity feels small". Her anatomy was unremarkable in oropharynx. Pt also reports feeling like her food/liquid is coming back up when she leans over, well after her meals. Pt may benefit from esophageal assessment. Will send results to Dr. Laural Golden. PLEASE SEE PACS IMAGES #13 and 14 under imaging tab of MBSS report>>    Plan - 02/27/22 1439     Clinical Impression Statement Pt presents with normal oropharyngeal swallow characterized by premature spillage of fist teaspoon presentation thin with swallow trigger at the valleculae and reduced epiglottic deflection with tsp/cup sips thin resulting in one episode of flash penetration in trace amount with tsp thin. Pharyngeal swallow WNL with remainder of assessment with varied textures and consistencies (thin cup/straw,  puree, regular, pill). Esophageal sweep was remarkable for brief stasis of barium tablet in the thoracic esophagus, but was cleared when followed with liquid wash. This study was reviewed with Pt. She indicates that her swallowing has not gotten worse over the years (she was seen in 2017 for MBSS with similar reports), but has not improved. Her voice is mildly hoarse today and she indicates that she has noticed hoarse vocal quality at times. She reports that if she stops taking Nexium, she gets "sores" and a bad taste  in her mouth. She drinks ~4 cups of coffee a day and minimal water intake. SLP suggested that her symptoms could be attributed at least partially to GERD/LPR. Pt states she is unwilling to stop drinking coffee as this is enjoyable/important to her. Pt was given written reflux precautions. No further SLP services indicated at this time. Pt states she is not interested in seeking ENT consult to further investigate voice/LPR.     Consulted and Agree with Plan of Care Patient             Patient will benefit from skilled therapeutic intervention in order to improve the following deficits and impairments:   Dysphagia, oropharyngeal phase     Recommendations/Treatment - 02/27/22 1438       Swallow Evaluation Recommendations   SLP Diet Recommendations Age appropriate regular;Thin    Liquid Administration via Cup;Straw    Medication Administration Whole meds with liquid    Supervision Patient able to self feed    Postural Changes Seated upright at 90 degrees              Prognosis - 02/27/22 1438       Prognosis   Prognosis for Safe Diet Advancement Good      Individuals Consulted   Consulted and Agree with Results and Recommendations Patient    Report Sent to  Referring physician             Problem List Patient Active Problem List   Diagnosis Date Noted   Choking 01/29/2022   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  Genene Churn, [place] 02/26/2022, 2:40 PM  [place] [address] [place], [place], [address] Phone: [phone]   Fax:  [phone]  Name: Ashley Carter MRN: [id number] Date of  Birth: 31-Dec-1955

## 2022-05-16 ENCOUNTER — Encounter (INDEPENDENT_AMBULATORY_CARE_PROVIDER_SITE_OTHER): Payer: Self-pay | Admitting: Gastroenterology

## 2022-07-30 ENCOUNTER — Ambulatory Visit (INDEPENDENT_AMBULATORY_CARE_PROVIDER_SITE_OTHER): Payer: Medicare Other | Admitting: Gastroenterology

## 2022-08-06 ENCOUNTER — Encounter (INDEPENDENT_AMBULATORY_CARE_PROVIDER_SITE_OTHER): Payer: Self-pay | Admitting: Gastroenterology

## 2022-08-06 ENCOUNTER — Ambulatory Visit (INDEPENDENT_AMBULATORY_CARE_PROVIDER_SITE_OTHER): Payer: Medicare Other | Admitting: Gastroenterology

## 2022-08-06 VITALS — BP 132/73 | HR 79 | Temp 97.8°F | Ht 64.0 in | Wt 124.5 lb

## 2022-08-06 DIAGNOSIS — R1310 Dysphagia, unspecified: Secondary | ICD-10-CM

## 2022-08-06 DIAGNOSIS — K219 Gastro-esophageal reflux disease without esophagitis: Secondary | ICD-10-CM | POA: Diagnosis not present

## 2022-08-06 DIAGNOSIS — K834 Spasm of sphincter of Oddi: Secondary | ICD-10-CM

## 2022-08-06 MED ORDER — ESOMEPRAZOLE MAGNESIUM 20 MG PO CPDR: 20.0000 mg | DELAYED_RELEASE_CAPSULE | Freq: Every day | ORAL | 3 refills | Status: DC

## 2022-08-06 NOTE — Patient Instructions (Signed)
I have sent a prescription for nexum  once daily Continue with urso  twice a day I will obtain labs from PCP if they did not check your liver function, we will need to update this Continue with chewing precautions, taking small bites, eating slowly and avoiding drinking too fast  Follow up 6 months

## 2022-08-06 NOTE — Progress Notes (Signed)
Referring Provider: Stoney Bang, FNP Primary Care Physician:  Stoney Bang, FNP Primary GI Physician: Levon Hedger   Chief Complaint  Patient presents with   Gastroesophageal Reflux    Follow up on GERD. Patient states nexium  was suppose to be cut down to  but never was.    HPI:   Ashley Carter is a 67 y.o. 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  Last seen October 2023, at that time she reported that after she had her dilation, she felt her swallowing improved for a month but then her symptoms recurred. She states that she is having episodes of choking and coughing when swallowing food and liquids. She thinks this is happening more frequently with liquids than with solids. Usually solids can lead to this if they are spicy, but there is if the food is better tolerated. Also states that she has a constant discomfort in her throat and has to clear her throat frequently. She has frequent nasal discharge.   Recommended to decrease nexium to  daily, schedule MBS.  Present:  GERD is well managed with nexium  daily. She denies any breakthrough symptoms. She thought nexium was going to be decreased to  after last OV but continues to get nexium  sent from mail pharmacy.   Dysphagia is about the same, she has to be careful with drinking stuff to fast, dysphagia occurs more with liquids though she notes no real change in this over the past few years.   She reports history of sphincter of Oddi, issues since 1993. She notes some ongoing, intermittent pain in her epigastric region, she notes this occurring about once per month. No precipitating factors. Notes that she can usually move around, stretch or undo her bra and symptoms  will resolve after a minute or two no nausea or vomiting, no radiation of pain. Had recent labs in November with her PCP, she will try to get copies of these for [place]. She notes she does not always take Urso BID as it causes worsening constipation.   She notes some fluctuation in her weight and feels that being a vegetarian and her RA make it hard to figure out what things she can eat. No rectal bleeding or melena. No issues with appetite.   MBS: 02/2022 scant laryngeal penetration, no other abnormalities.  Last EGD: 10/04/2021 - No endoscopic esophageal abnormality to explain patient's dysphagia. Esophagus dilated. - 2 cm hiatal hernia. - Gastroesophageal flap valve classified as Hill Grade II (fold present, opens with respiration). - Normal stomach. - Normal examined duodenum. - No specimens collected.   Last Colonoscopy: 10/04/2021 - Perianal skin tags found on perianal exam. - One 1 mm polyp in the cecum, removed with a cold biopsy forceps. - Diverticulosis in the transverse colon.   Path: A. COLON, CECAL, POLYPECTOMY:  Findings consistent with tubular adenoma.  No high-grade dysplasia or malignancy is seen.    Repeat in 7 years   Past Medical History:  Diagnosis Date   Arthritis    GERD (gastroesophageal reflux disease)    Hemorrhoid    High cholesterol    IBS (irritable bowel syndrome)    PONV (postoperative nausea and vomiting)     Past Surgical History:  Procedure Laterality Date   BIOPSY  03/23/2016   Procedure:  BIOPSY;  Surgeon: Malissa Hippo, MD;  [facility];  Service: Endoscopy;;  gastric bx   CHOLECYSTECTOMY     1993   COLONOSCOPY  05/31/2011   Procedure: COLONOSCOPY;  Surgeon: Malissa Hippo, MD;  [facility];  Service: Endoscopy;  Laterality: N/A;  1200   COLONOSCOPY WITH PROPOFOL N/A 10/04/2021   Procedure: COLONOSCOPY WITH PROPOFOL;  Surgeon: Dolores Frame, MD;  [facility];  Service: Gastroenterology;   Laterality: N/A;  730 ASA 2   ESOPHAGEAL DILATION N/A 03/23/2016   Procedure: ESOPHAGEAL DILATION;  Surgeon: Malissa Hippo, MD;  [facility];  Service: Endoscopy;  Laterality: N/A;   ESOPHAGEAL DILATION N/A 10/04/2021   Procedure: ESOPHAGEAL DILATION;  Surgeon: Dolores Frame, MD;  [facility];  Service: Gastroenterology;  Laterality: N/A;   ESOPHAGOGASTRODUODENOSCOPY N/A 03/23/2016   Procedure: ESOPHAGOGASTRODUODENOSCOPY (EGD);  Surgeon: Malissa Hippo, MD;  [facility];  Service: Endoscopy;  Laterality: N/A;  1:50   ESOPHAGOGASTRODUODENOSCOPY (EGD) WITH PROPOFOL N/A 10/04/2021   Procedure: ESOPHAGOGASTRODUODENOSCOPY (EGD) WITH PROPOFOL;  Surgeon: Dolores Frame, MD;  [facility];  Service: Gastroenterology;  Laterality: N/A;   POLYPECTOMY  10/04/2021   Procedure: POLYPECTOMY;  Surgeon: Marguerita Merles, Reuel Boom, MD;  [facility];  Service: Gastroenterology;;   RECTO-VAGINAL FISSURE REPAIR  x 2   after child birth   spincter of oddi dysfunction     repair x2    Current Outpatient Medications  Medication Sig Dispense Refill   Calcium Carbonate-Vit D-Min (CALCIUM 1200) 1200-1000 MG-UNIT CHEW Chew 1 tablet by mouth 2 (two) times daily. Patient reports that this is soft capsules.     cyanocobalamin (,VITAMIN B-12,) 1000 MCG/ML injection Inject 1,000 mcg into the muscle every 30 (thirty) days.     esomeprazole (NEXIUM) 20 MG capsule Take 1 capsule (20 mg total) by mouth daily before breakfast. 90 capsule 3   Flaxseed, Linseed, 1000 MG CAPS Take 1,000 mg by mouth in the morning, at noon, and at bedtime.     GLUCOSAMINE-CALCIUM-VIT D PO Take 1 tablet by mouth in the morning and at bedtime.     hydrochlorothiazide (MICROZIDE) 12.5 MG capsule Take 12.5 mg by mouth daily as needed (ankle swelling).     Lactobacillus-Inulin (CULTURELLE DIGESTIVE DAILY PO) Take 1 Capful by mouth daily.     loratadine (CLARITIN) 10 MG  tablet Take 10 mg by mouth daily as needed for allergies.     naproxen sodium (ALEVE) 220 MG tablet Take 220 mg by mouth daily as needed (pain).     OVER THE COUNTER MEDICATION Take 1 Capful by mouth daily at 6 (six) AM. Darrold Junker UTI prevention     OVER THE COUNTER MEDICATION Nutrafol Qid     ursodiol (ACTIGALL) 500 MG tablet Take 1 tablet (500 mg total) by mouth 2 (two) times daily. 180 tablet 3   No current facility-administered medications for this visit.    Allergies as of 08/06/2022 - Review Complete 08/06/2022  Allergen Reaction Noted   Kiwi extract Anaphylaxis and Swelling 05/22/2011   Other Anaphylaxis and Swelling 05/22/2011   Morphine and related Nausea And Vomiting 04/05/2011   Vibramycin [doxycycline calcium] Other (See Comments) 04/05/2011    Family History  Problem Relation Age of Onset   Anesthesia problems Neg Hx    Hypotension Neg Hx    Malignant hyperthermia Neg Hx    Pseudochol deficiency Neg Hx    Colon cancer Neg  Hx     Social History   Socioeconomic History   Marital status: Married    Spouse name: Not on file   Number of children: Not on file   Years of education: Not on file   Highest education level: Not on file  Occupational History   Not on file  Tobacco Use   Smoking status: Never   Smokeless tobacco: Never  Vaping Use   Vaping Use: Never used  Substance and Sexual Activity   Alcohol use: No   Drug use: No   Sexual activity: Not on file  Other Topics Concern   Not on file  Social History Narrative   Not on file   Social Determinants of Health   Financial Resource Strain: Not on file  Food Insecurity: Not on file  Transportation Needs: Not on file  Physical Activity: Not on file  Stress: Not on file  Social Connections: Not on file   Review of systems General: negative for malaise, night sweats, fever, chills, weight loss Neck: Negative for lumps, goiter, pain and significant neck swelling Resp: Negative for cough, wheezing,  dyspnea at rest CV: Negative for chest pain, leg swelling, palpitations, orthopnea GI: denies melena, hematochezia, nausea, vomiting, diarrhea, constipation, dysphagia, odyonophagia, early satiety or unintentional weight loss. +epigastric discomfort  MSK: Negative for joint pain or swelling, back pain, and muscle pain. Derm: Negative for itching or rash Psych: Denies depression, anxiety, memory loss, confusion. No homicidal or suicidal ideation.  Heme: Negative for prolonged bleeding, bruising easily, and swollen nodes. Endocrine: Negative for cold or heat intolerance, polyuria, polydipsia and goiter. Neuro: negative for tremor, gait imbalance, syncope and seizures. The remainder of the review of systems is noncontributory.  Physical Exam: There were no vitals taken for this visit. General:   Alert and oriented. No distress noted. Pleasant and cooperative.  Head:  Normocephalic and atraumatic. Eyes:  Conjuctiva clear without scleral icterus. Mouth:  Oral mucosa pink and moist. Good dentition. No lesions. Heart: Normal rate and rhythm, s1 and s2 heart sounds present.  Lungs: Clear lung sounds in all lobes. Respirations equal and unlabored. Abdomen:  +BS, soft, non-tender and non-distended. No rebound or guarding. No HSM or masses noted. Derm: No palmar erythema or jaundice Msk:  Symmetrical without gross deformities. Normal posture. Extremities:  Without edema. Neurologic:  Alert and  oriented x4 Psych:  Alert and cooperative. Normal mood and affect.  Invalid input(s): "6 MONTHS"   ASSESSMENT: Ashley Carter is a 67 y.o. female presenting today for follow up of GERD/dysphagia.   GERD well controlled on nexium 40mg  daily, no breakthrough, PPI was to be decreased to 20mg  at last visit but she continues to receive 40mg  dosing, will send refill for Nexium 20mg  daily.   Her dysphagia remains the same, no real change, she has had recent EGD and MBS with no real findings as to the cause  of her symptoms. She tries to eat and drink slowly and take small bites to avoid choking.  History of sphincter of Oddi dysfunction s/p sphincterotomy and balloon dilation, dating back to 1993, she is on Urso 500mg  BID for possible microlithiasis. She had labs with PCP recently, will obtain a copy of these to ensure LFTs have been updated, if not, will need to check a CMP. She notes some intermittent epigastric pain occurring once a month maybe, improved with movement/stretching. This has been a chronic occurrence for her. No radiation of pain. No nausea or vomiting, no jaundice or itching.  PLAN:  Obtain recent labs from PCP  2.  Continue with urso  BID (needs updated LFTs if PCP did not check)  3. Nexium  daily  4. Continue with chewing precautions   All questions were answered, patient verbalized understanding and is in agreement with plan as outlined above.   Follow Up: 6 month   Tammie Ellsworth L. Jeanmarie Hubert, MSN, APRN, AGNP-C Adult-Gerontology Nurse Practitioner [facility]

## 2023-01-07 ENCOUNTER — Other Ambulatory Visit (INDEPENDENT_AMBULATORY_CARE_PROVIDER_SITE_OTHER): Payer: Self-pay | Admitting: Gastroenterology

## 2023-01-07 DIAGNOSIS — K8050 Calculus of bile duct without cholangitis or cholecystitis without obstruction: Secondary | ICD-10-CM

## 2023-01-07 DIAGNOSIS — R1310 Dysphagia, unspecified: Secondary | ICD-10-CM

## 2023-01-07 DIAGNOSIS — I10 Essential (primary) hypertension: Secondary | ICD-10-CM

## 2023-01-07 DIAGNOSIS — K219 Gastro-esophageal reflux disease without esophagitis: Secondary | ICD-10-CM

## 2023-01-07 NOTE — Telephone Encounter (Signed)
Last seen 08/06/22

## 2023-01-09 NOTE — Telephone Encounter (Signed)
Left message to return call 

## 2023-01-10 ENCOUNTER — Other Ambulatory Visit (INDEPENDENT_AMBULATORY_CARE_PROVIDER_SITE_OTHER): Payer: Self-pay | Admitting: *Deleted

## 2023-01-10 DIAGNOSIS — K8050 Calculus of bile duct without cholangitis or cholecystitis without obstruction: Secondary | ICD-10-CM

## 2023-01-10 DIAGNOSIS — K834 Spasm of sphincter of Oddi: Secondary | ICD-10-CM

## 2023-01-10 NOTE — Telephone Encounter (Signed)
Patient states she has not had labs since last November. I let her know I would mail out lab order for cmp for her to do. She states she does not need refill until her appt end of October.

## 2023-01-22 LAB — COMPREHENSIVE METABOLIC PANEL
ALT: 13 [IU]/L (ref 0–32)
AST: 22 [IU]/L (ref 0–40)
Albumin: 4.1 g/dL (ref 3.9–4.9)
Alkaline Phosphatase: 126 [IU]/L — ABNORMAL HIGH (ref 44–121)
BUN/Creatinine Ratio: 8 — ABNORMAL LOW (ref 12–28)
BUN: 6 mg/dL — ABNORMAL LOW (ref 8–27)
Bilirubin Total: 0.3 mg/dL (ref 0.0–1.2)
CO2: 25 mmol/L (ref 20–29)
Calcium: 9.6 mg/dL (ref 8.7–10.3)
Chloride: 104 mmol/L (ref 96–106)
Creatinine, Ser: 0.72 mg/dL (ref 0.57–1.00)
Globulin, Total: 2.5 g/dL (ref 1.5–4.5)
Glucose: 99 mg/dL (ref 70–99)
Potassium: 4.6 mmol/L (ref 3.5–5.2)
Sodium: 142 mmol/L (ref 134–144)
Total Protein: 6.6 g/dL (ref 6.0–8.5)
eGFR: 91 mL/min/{1.73_m2} (ref >59)

## 2023-01-25 ENCOUNTER — Encounter (INDEPENDENT_AMBULATORY_CARE_PROVIDER_SITE_OTHER): Payer: Self-pay

## 2023-02-11 ENCOUNTER — Ambulatory Visit (INDEPENDENT_AMBULATORY_CARE_PROVIDER_SITE_OTHER): Payer: Medicare Other | Admitting: Gastroenterology

## 2023-02-14 ENCOUNTER — Encounter (INDEPENDENT_AMBULATORY_CARE_PROVIDER_SITE_OTHER): Payer: Self-pay | Admitting: Gastroenterology

## 2023-02-14 ENCOUNTER — Ambulatory Visit (INDEPENDENT_AMBULATORY_CARE_PROVIDER_SITE_OTHER): Payer: Medicare Other | Admitting: Gastroenterology

## 2023-02-14 VITALS — BP 133/67 | HR 74 | Temp 97.8°F | Ht 64.5 in | Wt 123.4 lb

## 2023-02-14 DIAGNOSIS — K834 Spasm of sphincter of Oddi: Secondary | ICD-10-CM | POA: Diagnosis not present

## 2023-02-14 DIAGNOSIS — K219 Gastro-esophageal reflux disease without esophagitis: Secondary | ICD-10-CM | POA: Diagnosis not present

## 2023-02-14 NOTE — Patient Instructions (Addendum)
Continue Nexium 40 mg every day Continue with ursodiol 500 mg in the morning and 500 mg every other night.  If presenting recurrent abdominal discomfort episodes, may need to go back to 500 mg twice a day.  If presenting constipation with this, can take MiraLAX on a daily basis.

## 2023-02-14 NOTE — Progress Notes (Signed)
Ashley Carter, M.D. Gastroenterology & Hepatology [facility] Gastroenterology [address] [place], [place] [address]  Primary Care Physician: Stoney Bang, FNP [address] [place] [address]  I will communicate my assessment and recommendations to the referring MD via EMR.  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  The patient was last seen on 08/06/2022. At that time, the patient was continued on ursodiol 500 mg twice daily.  She was to continue Nexium 20 mg every day.  Patient is having more frequent episodes of stabbing epigastric pain , which lasts 10 second. As she has had more symptoms, she has taken the night ursodiol dose more compliantly during the night. She also increased the Nexium to 40 mg qday. Made these changes at the same time. Since then,she has felt some relief of the symptoms as they are presenting less frequently. She also readjusts her clothing and move body to help with the discomfort.  She also reports that when these episodes happen, she may have bowel movements 3-4 times a day, which have a soft consistency.  The patient denies having any nausea, vomiting, fever, chills, hematochezia, melena, hematemesis, abdominal distention, abdominal pain, jaundice, pruritus. Weight has decreased some as she has had some decreased appetite, but also she changed her diet significantly.  Most recent labs from 01/21/2023 showed up very mildly elevated alkaline  phosphatase of 126, albumin 4.1, AST 22, ALT 13, total bilirubin 0.3, normal electrolytes and renal function.  MBS: 02/2022 scant laryngeal penetration, no other abnormalities.  Last EGD: 10/04/2021 - No endoscopic esophageal abnormality to explain patient's dysphagia. Esophagus dilated. - 2 cm hiatal hernia. - Gastroesophageal flap valve classified as Hill Grade II (fold present, opens with respiration). - Normal stomach. - Normal examined duodenum. - No specimens collected.   Last Colonoscopy: 10/04/2021 - Perianal skin tags found on perianal exam. - One 1 mm polyp in the cecum, removed with a cold biopsy forceps. - Diverticulosis in the transverse colon.   Path: A. COLON, CECAL, POLYPECTOMY:  Findings consistent with tubular adenoma.  No high-grade dysplasia or malignancy is seen.    Repeat in 7 years  Past Medical History: Past Medical History:  Diagnosis Date   Arthritis    GERD (gastroesophageal reflux disease)    Hemorrhoid    High cholesterol    IBS (irritable bowel syndrome)    PONV (postoperative nausea and vomiting)     Past Surgical History: Past Surgical History:  Procedure Laterality Date   BIOPSY  03/23/2016   Procedure: BIOPSY;  Surgeon: Malissa Hippo, MD;  [facility];  Service: Endoscopy;;  gastric bx   CHOLECYSTECTOMY     1993   COLONOSCOPY  05/31/2011   Procedure: COLONOSCOPY;  Surgeon: Malissa Hippo, MD;  [facility];  Service: Endoscopy;  Laterality: N/A;  1200   COLONOSCOPY WITH PROPOFOL N/A 10/04/2021   Procedure: COLONOSCOPY WITH PROPOFOL;  Surgeon: Dolores Frame, MD;  [facility];  Service: Gastroenterology;  Laterality: N/A;  730 ASA 2   ESOPHAGEAL DILATION N/A 03/23/2016   Procedure:  ESOPHAGEAL DILATION;  Surgeon: Malissa Hippo, MD;  [facility];  Service: Endoscopy;  Laterality: N/A;   ESOPHAGEAL DILATION N/A 10/04/2021   Procedure: ESOPHAGEAL DILATION;  Surgeon: Dolores Frame, MD;  [facility];  Service: Gastroenterology;  Laterality: N/A;   ESOPHAGOGASTRODUODENOSCOPY N/A 03/23/2016   Procedure: ESOPHAGOGASTRODUODENOSCOPY (EGD);  Surgeon: Malissa Hippo, MD;  [facility];  Service: Endoscopy;  Laterality: N/A;  1:50   ESOPHAGOGASTRODUODENOSCOPY (EGD) WITH PROPOFOL N/A 10/04/2021   Procedure: ESOPHAGOGASTRODUODENOSCOPY (EGD) WITH PROPOFOL;  Surgeon: Dolores Frame, MD;  [facility];  Service: Gastroenterology;  Laterality: N/A;   POLYPECTOMY  10/04/2021   Procedure: POLYPECTOMY;  Surgeon: Marguerita Merles, Reuel Boom, MD;  [facility];  Service: Gastroenterology;;   RECTO-VAGINAL FISSURE REPAIR  x 2   after child birth   spincter of oddi dysfunction     repair x2    Family History: Family History  Problem Relation Age of Onset   Anesthesia problems Neg Hx    Hypotension Neg Hx    Malignant hyperthermia Neg Hx    Pseudochol deficiency Neg Hx    Colon cancer Neg Hx     Social History: Social History   Tobacco Use  Smoking Status Never   Passive exposure: Never  Smokeless Tobacco Never   Social History   Substance and Sexual Activity  Alcohol Use No   Social History   Substance and Sexual Activity  Drug Use No    Allergies: Allergies  Allergen Reactions   Kiwi Extract Anaphylaxis and Swelling    Swelling of the throat   Other Anaphylaxis and Swelling    English walnuts   Morphine And Codeine Nausea And Vomiting    Dry heaves    Vibramycin [Doxycycline Calcium] Other (See Comments)    Stomach Ulcers     Medications: Current Outpatient Medications  Medication Sig Dispense Refill   APPLE CIDER VINEGAR PO Take by mouth. 1200 mg one daily     Calcium Carbonate-Vit D-Min (CALCIUM 1200) 1200-1000 MG-UNIT CHEW Chew 1 tablet by mouth 2 (two) times daily. Patient reports that this is soft capsules.     cyanocobalamin (,VITAMIN B-12,) 1000 MCG/ML injection Inject 1,000 mcg  into the muscle every 30 (thirty) days.     esomeprazole (NEXIUM) 20 MG capsule Take 1 capsule (20 mg total) by mouth daily before breakfast. (Patient taking differently: Take 40 mg by mouth daily before breakfast.) 90 capsule 3   Flaxseed, Linseed, 1000 MG CAPS Take 1,000 mg by mouth in the morning, at noon, and at bedtime.     GLUCOSAMINE-CALCIUM-VIT D PO Take 1 tablet by mouth in the morning and at bedtime.     hydrochlorothiazide (MICROZIDE) 12.5 MG capsule Take 12.5 mg by mouth daily as needed (ankle swelling).     Lactobacillus-Inulin (CULTURELLE DIGESTIVE DAILY PO) Take 1 Capful by mouth daily.     loratadine (CLARITIN) 10 MG tablet Take 10 mg by mouth daily as needed for allergies.     naproxen sodium (ALEVE) 220 MG tablet Take 220 mg by mouth daily as needed (pain).     OVER THE COUNTER MEDICATION Take 1 Capful by mouth daily at 6 (six) AM. Darrold Junker UTI prevention     OVER THE COUNTER MEDICATION Nutrafol Qid     ursodiol (ACTIGALL) 500 MG tablet Take 1 tablet (500 mg total) by mouth 2 (two) times daily. 180 tablet 3   No current facility-administered medications for this visit.  Review of Systems: GENERAL: negative for malaise, night sweats HEENT: No changes in hearing or vision, no nose bleeds or other nasal problems. NECK: Negative for lumps, goiter, pain and significant neck swelling RESPIRATORY: Negative for cough, wheezing CARDIOVASCULAR: Negative for chest pain, leg swelling, palpitations, orthopnea GI: SEE HPI MUSCULOSKELETAL: Negative for joint pain or swelling, back pain, and muscle pain. SKIN: Negative for lesions, rash PSYCH: Negative for sleep disturbance, mood disorder and recent psychosocial stressors. HEMATOLOGY Negative for prolonged bleeding, bruising easily, and swollen nodes. ENDOCRINE: Negative for cold or heat intolerance, polyuria, polydipsia and goiter. NEURO: negative for tremor, gait imbalance, syncope and seizures. The remainder of the review of systems  is noncontributory.   Physical Exam: BP 133/67 (BP Location: Left Arm, Patient Position: Sitting, Cuff Size: Normal)   Pulse 74   Temp 97.8 F (36.6 C) (Temporal)   Ht 5' 4.5" (1.638 m)   Wt 123 lb 6.4 oz (56 kg)   BMI 20.85 kg/m  GENERAL: The patient is AO x3, in no acute distress. HEENT: Head is normocephalic and atraumatic. EOMI are intact. Mouth is well hydrated and without lesions. NECK: Supple. No masses LUNGS: Clear to auscultation. No presence of rhonchi/wheezing/rales. Adequate chest expansion HEART: RRR, normal s1 and s2. ABDOMEN: Soft, nontender, no guarding, no peritoneal signs, and nondistended. BS +. No masses. EXTREMITIES: Without any cyanosis, clubbing, rash, lesions or edema. NEUROLOGIC: AOx3, no focal motor deficit. SKIN: no jaundice, no rashes  Imaging/Labs: as above  I personally reviewed and interpreted the available labs, imaging and endoscopic files.  Impression and Plan: Ashley Carter is a 67 y.o. 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  The patient has presented some recent worsening of her intermittent episodes of epigastric pain.  She has had more episodes than usual but they have not lasted for long time.  Fortunately, she has improved with the increase of her Nexium, but also while taking her night dose of ursodiol more compliantly.  She has not presented any other associated symptoms or red flag signs.  For now, we will observe how she does with the current regimen but I advised her that if her symptoms worsen she may need to take the ursodiol twice a day compliantly and take MiraLAX to avoid constipation.  -Continue Nexium 40 mg every day -Continue with ursodiol 500 mg in the morning and 500 mg  every other night.  If presenting recurrent abdominal discomfort episodes, may need to go back to 500 mg twice a day.  If presenting constipation with this, can take MiraLAX on a daily basis.  All questions were answered.      Ashley Blazing, MD Gastroenterology and Hepatology [facility] Gastroenterology

## 2023-03-18 ENCOUNTER — Other Ambulatory Visit (INDEPENDENT_AMBULATORY_CARE_PROVIDER_SITE_OTHER): Payer: Self-pay | Admitting: *Deleted

## 2023-03-18 DIAGNOSIS — K219 Gastro-esophageal reflux disease without esophagitis: Secondary | ICD-10-CM

## 2023-03-18 MED ORDER — ESOMEPRAZOLE MAGNESIUM 40 MG PO CPDR: 40.0000 mg | DELAYED_RELEASE_CAPSULE | Freq: Every day | ORAL | 1 refills | Status: DC

## 2023-03-29 ENCOUNTER — Encounter (INDEPENDENT_AMBULATORY_CARE_PROVIDER_SITE_OTHER): Payer: Self-pay

## 2023-08-12 ENCOUNTER — Other Ambulatory Visit (INDEPENDENT_AMBULATORY_CARE_PROVIDER_SITE_OTHER): Payer: Self-pay

## 2023-08-12 DIAGNOSIS — R1310 Dysphagia, unspecified: Secondary | ICD-10-CM

## 2023-08-12 DIAGNOSIS — K219 Gastro-esophageal reflux disease without esophagitis: Secondary | ICD-10-CM

## 2023-08-12 DIAGNOSIS — I10 Essential (primary) hypertension: Secondary | ICD-10-CM

## 2023-08-12 DIAGNOSIS — K8050 Calculus of bile duct without cholangitis or cholecystitis without obstruction: Secondary | ICD-10-CM

## 2023-08-12 MED ORDER — URSODIOL 500 MG PO TABS: 500.0000 mg | ORAL_TABLET | Freq: Two times a day (BID) | ORAL | 3 refills | Status: AC

## 2023-08-12 NOTE — Telephone Encounter (Signed)
 Patient needs ursodiol  500 mg bid sent to [facility] please. Last seen 02/14/2024 due to Sphincter of Oddi dysfunction, and Gerd.

## 2023-08-18 ENCOUNTER — Telehealth (INDEPENDENT_AMBULATORY_CARE_PROVIDER_SITE_OTHER): Payer: Self-pay | Admitting: Gastroenterology

## 2023-08-18 NOTE — Telephone Encounter (Signed)
 Received the lab results from 08/06/2023 which showed presence of elevated levels of IgE against pork, beef, lamb and alpha gal, TSH was normal at 1.63 and hemoglobin A1c was 5.5.  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  This may cause diarrhea, abdominal bloating or abdominal pain when consuming red meat such as pork, beef, lamb or venison.  Also some capsules may have gelatin coming from mammalian origin and it will cause the symptoms.  She should abstain from ingesting this if she is having any of the symptoms.  Thanks,  Samantha Cress, MD Gastroenterology and Hepatology [facility] Gastroenterology

## 2023-08-19 ENCOUNTER — Encounter (INDEPENDENT_AMBULATORY_CARE_PROVIDER_SITE_OTHER): Payer: Self-pay

## 2023-08-20 NOTE — Telephone Encounter (Signed)
 Per Dr. Sammi Crick,  Received the lab results from 08/06/2023 which showed presence of elevated levels of IgE against pork, beef, lamb and alpha gal, TSH was normal at 1.63 and hemoglobin A1c was 5.5.   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  This may cause diarrhea, abdominal bloating or abdominal pain when consuming red meat such as pork, beef, lamb or venison.  Also some capsules may have gelatin coming from mammalian origin and it will cause the symptoms.  She should abstain from ingesting this if she is having any of the symptoms.   Thanks,   Samantha Cress, MD Gastroenterology and Hepatology [facility] Gastroenterology     Last read by Ruby Corporal at 6:16PM on 08/19/2023.

## 2023-08-22 ENCOUNTER — Other Ambulatory Visit (INDEPENDENT_AMBULATORY_CARE_PROVIDER_SITE_OTHER): Payer: Self-pay | Admitting: Gastroenterology

## 2023-08-22 DIAGNOSIS — K219 Gastro-esophageal reflux disease without esophagitis: Secondary | ICD-10-CM

## 2023-11-20 ENCOUNTER — Other Ambulatory Visit (INDEPENDENT_AMBULATORY_CARE_PROVIDER_SITE_OTHER): Payer: Self-pay | Admitting: Gastroenterology

## 2023-11-20 DIAGNOSIS — K219 Gastro-esophageal reflux disease without esophagitis: Secondary | ICD-10-CM

## 2024-01-29 ENCOUNTER — Encounter (INDEPENDENT_AMBULATORY_CARE_PROVIDER_SITE_OTHER): Payer: Self-pay | Admitting: Gastroenterology

## 2024-02-17 ENCOUNTER — Ambulatory Visit (INDEPENDENT_AMBULATORY_CARE_PROVIDER_SITE_OTHER): Payer: Medicare Other | Admitting: Gastroenterology

## 2024-03-02 ENCOUNTER — Ambulatory Visit (INDEPENDENT_AMBULATORY_CARE_PROVIDER_SITE_OTHER): Admitting: Gastroenterology

## 2024-03-02 ENCOUNTER — Encounter (INDEPENDENT_AMBULATORY_CARE_PROVIDER_SITE_OTHER): Payer: Self-pay | Admitting: Gastroenterology

## 2024-03-02 VITALS — BP 148/81 | HR 82 | Temp 97.5°F | Ht 64.5 in | Wt 117.0 lb

## 2024-03-02 DIAGNOSIS — R1013 Epigastric pain: Secondary | ICD-10-CM | POA: Diagnosis not present

## 2024-03-02 MED ORDER — DICYCLOMINE HCL 10 MG PO CAPS: 10.0000 mg | ORAL_CAPSULE | Freq: Two times a day (BID) | ORAL | 1 refills | Status: AC | PRN

## 2024-03-02 NOTE — Patient Instructions (Signed)
 Perform blood workup If presenting recurrent severe abdominal discomfort/pain, please call us  to schedule stat imaging (MRCP).  If not able to proceed with this, we will need to have urgent evaluation in the ER. Start Bentyl  1 tablet q12h as needed for abdominal pain

## 2024-03-02 NOTE — Progress Notes (Signed)
 Toribio Fortune, M.D. Gastroenterology & Hepatology [facility] Gastroenterology [address] [place], [place] [address]  Primary Care Physician: Mikle Beau, FNP [address] [place] [address]  I will communicate my assessment and recommendations to the referring MD via EMR.  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  The patient was last seen on 02/14/2023. At that time, the patient was continued on Nexium  40 mg daily and URSO  500 mg every morning and 5 mg every other night.  Patient reports that since late October, she has presented 4 episodes of abdominal pain. These episodes have been more prolonged, with first one only lasting 2 minutes and the most recent one lasting 4 hours (last one was on 11/9). States the quality of the pain and discomfort was different. States that the pain goes to her back and between her shoulders. States that she had similar symptoms when she underwent ERCP with dilation in the past.  She may have some nausea and felt like retching to relieve her pain.  States possibly 2 weeks before her bouts of abdominal pain, she had a few bouts of diarrhea. These have resolved since then. No melena or hematochezia  The patient denies having any fever, chills, hematochezia, melena, hematemesis, abdominal distention,  diarrhea, jaundice, pruritus . Has lsot some weight for the last year.  MBS: 02/2022 scant laryngeal penetration, no other abnormalities.  Last EGD: 10/04/2021 - No endoscopic  esophageal abnormality to explain patient's dysphagia. Esophagus dilated. - 2 cm hiatal hernia. - Gastroesophageal flap valve classified as Hill Grade II (fold present, opens with respiration). - Normal stomach. - Normal examined duodenum. - No specimens collected.   Last Colonoscopy: 10/04/2021 - Perianal skin tags found on perianal exam. - One 1 mm polyp in the cecum, removed with a cold biopsy forceps. - Diverticulosis in the transverse colon.   Path: A. COLON, CECAL, POLYPECTOMY:  Findings consistent with tubular adenoma.  No high-grade dysplasia or malignancy is seen.    Repeat in 7 years  Past Medical History: Past Medical History:  Diagnosis Date   Arthritis    GERD (gastroesophageal reflux disease)    Hemorrhoid    High cholesterol    IBS (irritable bowel syndrome)    PONV (postoperative nausea and vomiting)     Past Surgical History: Past Surgical History:  Procedure Laterality Date   BIOPSY  03/23/2016   Procedure: BIOPSY;  Surgeon: Claudis RAYMOND Rivet, MD;  [facility];  Service: Endoscopy;;  gastric bx   CHOLECYSTECTOMY     1993   COLONOSCOPY  05/31/2011   Procedure: COLONOSCOPY;  Surgeon: Claudis RAYMOND Rivet, MD;  [facility];  Service: Endoscopy;  Laterality: N/A;  1200   COLONOSCOPY WITH PROPOFOL  N/A 10/04/2021   Procedure: COLONOSCOPY WITH PROPOFOL ;  Surgeon: Fortune Angelia Toribio, MD;  [facility];  Service: Gastroenterology;  Laterality: N/A;  730 ASA 2   ESOPHAGEAL DILATION N/A 03/23/2016   Procedure: ESOPHAGEAL DILATION;  Surgeon: Claudis RAYMOND Rivet, MD;  [facility];  Service: Endoscopy;  Laterality: N/A;   ESOPHAGEAL DILATION N/A 10/04/2021  Procedure: ESOPHAGEAL DILATION;  Surgeon: Eartha Flavors, Toribio, MD;  [facility];  Service: Gastroenterology;  Laterality: N/A;   ESOPHAGOGASTRODUODENOSCOPY N/A 03/23/2016   Procedure: ESOPHAGOGASTRODUODENOSCOPY (EGD);  Surgeon: Claudis RAYMOND Rivet, MD;  [facility];  Service: Endoscopy;  Laterality: N/A;  1:50   ESOPHAGOGASTRODUODENOSCOPY (EGD) WITH PROPOFOL  N/A 10/04/2021   Procedure: ESOPHAGOGASTRODUODENOSCOPY (EGD) WITH PROPOFOL ;  Surgeon: Eartha Flavors Toribio, MD;  [facility];  Service: Gastroenterology;  Laterality: N/A;   POLYPECTOMY  10/04/2021   Procedure: POLYPECTOMY;  Surgeon: Eartha Flavors, Toribio, MD;  [facility];  Service: Gastroenterology;;   RECTO-VAGINAL FISSURE REPAIR  x 2   after child birth   spincter of oddi dysfunction     repair x2    Family History: Family History  Problem Relation Age of Onset   Anesthesia problems Neg Hx    Hypotension Neg Hx    Malignant hyperthermia Neg Hx    Pseudochol deficiency Neg Hx    Colon cancer Neg Hx     Social History: Social History   Tobacco Use  Smoking Status Never   Passive exposure: Never  Smokeless Tobacco Never   Social History   Substance and Sexual Activity  Alcohol Use No   Social History   Substance and Sexual Activity  Drug Use No    Allergies: Allergies  Allergen Reactions   Kiwi Extract Anaphylaxis and Swelling    Swelling of the throat   Other Anaphylaxis and Swelling    English walnuts   Morphine And Codeine Nausea And Vomiting    Dry heaves    Vibramycin [Doxycycline Calcium] Other (See Comments)    Stomach Ulcers     Medications: Current Outpatient Medications  Medication Sig Dispense Refill   aspirin-acetaminophen-caffeine (EXCEDRIN MIGRAINE) 250-250-65 MG tablet Take by mouth every 6 (six) hours as needed for headache.     clobetasol ointment (TEMOVATE) 0.05 % Apply 1 Application topically 2 (two) times a week.     cyanocobalamin (,VITAMIN B-12,) 1000 MCG/ML injection Inject 1,000 mcg into the muscle every 30 (thirty) days.     esomeprazole  (NEXIUM ) 40 MG capsule TAKE 1 CAPSULE DAILY BEFORE BREAKFAST 90 capsule 3   hydrochlorothiazide (MICROZIDE) 12.5 MG capsule Take 12.5 mg by mouth daily as  needed (ankle swelling).     Lactobacillus-Inulin (CULTURELLE DIGESTIVE DAILY PO) Take 1 Capful by mouth daily.     loratadine (CLARITIN) 10 MG tablet Take 10 mg by mouth daily as needed for allergies.     OVER THE COUNTER MEDICATION Take 1 Capful by mouth daily at 6 (six) AM. U[place]ora UTI prevention     OVER THE COUNTER MEDICATION Nutrafol Qid     tacrolimus (PROTOPIC) 0.1 % ointment Apply topically as needed.     triamcinolone cream (KENALOG) 0.1 % Apply 1 Application topically as needed.     ursodiol  (ACTIGALL ) 500 MG tablet Take 1 tablet (500 mg total) by mouth 2 (two) times daily. 180 tablet 3   No current facility-administered medications for this visit.    Review of Systems: GENERAL: negative for malaise, night sweats HEENT: No changes in hearing or vision, no nose bleeds or other nasal problems. NECK: Negative for lumps, goiter, pain and significant neck swelling RESPIRATORY: Negative for cough, wheezing CARDIOVASCULAR: Negative for chest pain, leg swelling, palpitations, orthopnea GI: SEE HPI MUSCULOSKELETAL: Negative for joint pain or swelling, back pain, and muscle pain. SKIN: Negative for lesions, rash PSYCH: Negative for sleep disturbance, mood disorder and recent  psychosocial stressors. HEMATOLOGY Negative for prolonged bleeding, bruising easily, and swollen nodes. ENDOCRINE: Negative for cold or heat intolerance, polyuria, polydipsia and goiter. NEURO: negative for tremor, gait imbalance, syncope and seizures. The remainder of the review of systems is noncontributory.   Physical Exam: BP (!) 148/81 (BP Location: Left Arm, Patient Position: Sitting, Cuff Size: Normal)   Pulse 82   Temp (!) 97.5 F (36.4 C) (Temporal)   Ht 5' 4.5 (1.638 m)   Wt 117 lb (53.1 kg)   BMI 19.77 kg/m  GENERAL: The patient is AO x3, in no acute distress. HEENT: Head is normocephalic and atraumatic. EOMI are intact. Mouth is well hydrated and without lesions. NECK: Supple. No masses LUNGS:  Clear to auscultation. No presence of rhonchi/wheezing/rales. Adequate chest expansion HEART: RRR, normal s1 and s2. ABDOMEN: Soft, nontender, no guarding, no peritoneal signs, and nondistended. BS +. No masses. EXTREMITIES: Without any cyanosis, clubbing, rash, lesions or edema. NEUROLOGIC: AOx3, no focal motor deficit. SKIN: no jaundice, no rashes  Imaging/Labs: as above  I personally reviewed and interpreted the available labs, imaging and endoscopic files.  Impression and Plan: Ashley Carter is a 69 y.o. 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  The patient has presented recurrent issues of abdominal discomfort/pain recently.  She describes this episode similar to her previous bouts of sphincter of Oddi dysfunction, which have been self-limited but have increased in duration.  She is not currently presenting any active symptoms.  At this point we will perform blood workup to evaluate liver function tests and lipase to evaluate possible pancreatic involvement.  However, I advised to the patient that if she were to have recurrent symptoms she will need to let us  know to schedule a stat MRCP as this may have the highest yield of detecting an etiology of her symptomatology.  Nevertheless, if she is not able to have this test stat, she may need to have urgent evaluation in the ER.  For now, we will prescribe some Bentyl  as needed for management of possible bowel hypersensitivity which could be mimicking the symptoms.  - Check CBC, CMP, lipase - If presenting recurrent severe abdominal discomfort/pain, please call us  to schedule stat imaging (MRCP).  If not able to proceed with this, we will need to have urgent evaluation in the ER. - Start Bentyl  1 tablet q12h as needed for abdominal  pain  All questions were answered.      Toribio Fortune, MD Gastroenterology and Hepatology [facility] Gastroenterology

## 2024-03-05 ENCOUNTER — Ambulatory Visit (INDEPENDENT_AMBULATORY_CARE_PROVIDER_SITE_OTHER): Payer: Self-pay | Admitting: Gastroenterology

## 2024-03-05 LAB — COMPREHENSIVE METABOLIC PANEL WITH GFR
AG Ratio: 1.7 (calc) (ref 1.0–2.5)
ALT: 15 U/L (ref 6–29)
AST: 23 U/L (ref 10–35)
Albumin: 4.6 g/dL (ref 3.6–5.1)
Alkaline phosphatase (APISO): 104 U/L (ref 37–153)
BUN/Creatinine Ratio: 10 (calc) (ref 6–22)
BUN: 6 mg/dL — ABNORMAL LOW (ref 7–25)
CO2: 28 mmol/L (ref 20–32)
Calcium: 9.8 mg/dL (ref 8.6–10.4)
Chloride: 101 mmol/L (ref 98–110)
Creat: 0.61 mg/dL (ref 0.50–1.05)
Globulin: 2.7 g/dL (ref 1.9–3.7)
Glucose, Bld: 101 mg/dL (ref 65–139)
Potassium: 4.4 mmol/L (ref 3.5–5.3)
Sodium: 137 mmol/L (ref 135–146)
Total Bilirubin: 0.6 mg/dL (ref 0.2–1.2)
Total Protein: 7.3 g/dL (ref 6.1–8.1)
eGFR: 97 mL/min/1.73m2 (ref >=60)

## 2024-03-05 LAB — CBC WITH DIFFERENTIAL/PLATELET
Absolute Lymphocytes: 1495 {cells}/uL (ref 850–3900)
Absolute Monocytes: 405 {cells}/uL (ref 200–950)
Basophils Absolute: 41 {cells}/uL (ref 0–200)
Basophils Relative: 0.9 %
Eosinophils Absolute: 69 {cells}/uL (ref 15–500)
Eosinophils Relative: 1.5 %
HCT: 39.8 % (ref 35.0–45.0)
Hemoglobin: 12.9 g/dL (ref 11.7–15.5)
MCH: 27.4 pg (ref 27.0–33.0)
MCHC: 32.4 g/dL (ref 32.0–36.0)
MCV: 84.5 fL (ref 80.0–100.0)
MPV: 10.8 fL (ref 7.5–12.5)
Monocytes Relative: 8.8 %
Neutro Abs: 2590 {cells}/uL (ref 1500–7800)
Neutrophils Relative %: 56.3 %
Platelets: 266 Thousand/uL (ref 140–400)
RBC: 4.71 Million/uL (ref 3.80–5.10)
RDW: 12 % (ref 11.0–15.0)
Total Lymphocyte: 32.5 %
WBC: 4.6 Thousand/uL (ref 3.8–10.8)

## 2024-03-05 LAB — LIPASE: Lipase: 29 U/L (ref 7–60)
